# Patient Record
Sex: Female | Born: 1950 | Race: Black or African American | Hispanic: No | Smoking: Never smoker
Health system: Southern US, Community
[De-identification: ages and names within clinical notes are randomized; demographics above are authoritative.]

## PROBLEM LIST (undated history)

## (undated) DIAGNOSIS — I10 Essential (primary) hypertension: Secondary | ICD-10-CM

## (undated) HISTORY — DX: Essential (primary) hypertension: I10

---

## 1996-12-14 HISTORY — PX: LYSIS OF ADHESION: SHX5961

## 1997-12-14 HISTORY — PX: EXPLORATORY LAPAROTOMY: SUR591

## 1998-07-11 ENCOUNTER — Other Ambulatory Visit: Admission: RE | Admit: 1998-07-11 | Discharge: 1998-07-11 | Payer: Self-pay | Admitting: *Deleted

## 1999-10-14 ENCOUNTER — Encounter: Payer: Self-pay | Admitting: *Deleted

## 1999-10-14 ENCOUNTER — Ambulatory Visit (HOSPITAL_COMMUNITY): Admission: RE | Admit: 1999-10-14 | Discharge: 1999-10-14 | Payer: Self-pay | Admitting: *Deleted

## 2000-10-12 ENCOUNTER — Other Ambulatory Visit: Admission: RE | Admit: 2000-10-12 | Discharge: 2000-10-12 | Payer: Self-pay | Admitting: *Deleted

## 2001-09-28 ENCOUNTER — Encounter: Payer: Self-pay | Admitting: *Deleted

## 2001-09-28 ENCOUNTER — Ambulatory Visit (HOSPITAL_COMMUNITY): Admission: RE | Admit: 2001-09-28 | Discharge: 2001-09-28 | Payer: Self-pay | Admitting: *Deleted

## 2002-10-03 ENCOUNTER — Encounter: Payer: Self-pay | Admitting: *Deleted

## 2002-10-03 ENCOUNTER — Ambulatory Visit (HOSPITAL_COMMUNITY): Admission: RE | Admit: 2002-10-03 | Discharge: 2002-10-03 | Payer: Self-pay | Admitting: *Deleted

## 2003-08-16 ENCOUNTER — Ambulatory Visit (HOSPITAL_COMMUNITY): Admission: RE | Admit: 2003-08-16 | Discharge: 2003-08-16 | Payer: Self-pay | Admitting: *Deleted

## 2004-04-23 ENCOUNTER — Other Ambulatory Visit: Admission: RE | Admit: 2004-04-23 | Discharge: 2004-04-23 | Payer: Self-pay | Admitting: Internal Medicine

## 2004-10-08 ENCOUNTER — Ambulatory Visit (HOSPITAL_COMMUNITY): Admission: RE | Admit: 2004-10-08 | Discharge: 2004-10-08 | Payer: Self-pay | Admitting: Internal Medicine

## 2005-10-12 ENCOUNTER — Ambulatory Visit (HOSPITAL_COMMUNITY): Admission: RE | Admit: 2005-10-12 | Discharge: 2005-10-12 | Payer: Self-pay | Admitting: Internal Medicine

## 2006-05-25 ENCOUNTER — Other Ambulatory Visit: Admission: RE | Admit: 2006-05-25 | Discharge: 2006-05-25 | Payer: Self-pay | Admitting: Obstetrics and Gynecology

## 2006-06-03 ENCOUNTER — Encounter: Admission: RE | Admit: 2006-06-03 | Discharge: 2006-06-03 | Payer: Self-pay | Admitting: Obstetrics and Gynecology

## 2006-10-15 ENCOUNTER — Encounter: Admission: RE | Admit: 2006-10-15 | Discharge: 2006-10-15 | Payer: Self-pay | Admitting: Internal Medicine

## 2008-10-30 ENCOUNTER — Ambulatory Visit (HOSPITAL_COMMUNITY): Admission: RE | Admit: 2008-10-30 | Discharge: 2008-10-30 | Payer: Self-pay | Admitting: Internal Medicine

## 2009-11-06 ENCOUNTER — Ambulatory Visit (HOSPITAL_COMMUNITY): Admission: RE | Admit: 2009-11-06 | Discharge: 2009-11-06 | Payer: Self-pay | Admitting: Internal Medicine

## 2010-11-10 ENCOUNTER — Ambulatory Visit (HOSPITAL_COMMUNITY): Admission: RE | Admit: 2010-11-10 | Discharge: 2010-11-10 | Payer: Self-pay | Admitting: Internal Medicine

## 2011-05-01 NOTE — Op Note (Signed)
   NAME:  Joyce Price, STUCKERT                         ACCOUNT NO.:  [id number]   MEDICAL RECORD NO.:  [id number]                   PATIENT TYPE:  AMB   LOCATION:  ENDO                                 FACILITY:  [facility]   PHYSICIAN:  Georgiana Spinner, M.D.                 DATE OF BIRTH:  Apr 16, 1951   DATE OF PROCEDURE:  08/16/2003  DATE OF DISCHARGE:                                 OPERATIVE REPORT   PROCEDURE:  Colonoscopy.   INDICATIONS:  Colon cancer screening.   ANESTHESIA:  Demerol 70 mg, Versed 7 mg.   DESCRIPTION OF PROCEDURE:  With the patient mildly sedated in the left  lateral decubitus position, the Olympus videoscopic colonoscope was inserted  in the rectum and passed under direct vision to the cecum, identified by the  ileocecal valve and appendiceal orifice, both of which were photographed.  From this point the colonoscope was slowly withdrawn, taking circumferential  views of the colonic mucosa, stopping only in the rectum, which appeared  normal on direct and retroflexed view.  The endoscope was straightened and  withdrawn.  The patient's vital signs and pulse oximetry remained stable.  The patient tolerated the procedure well without apparent complications.   FINDINGS:  Unremarkable examination.   PLAN:  Consider repeat examination in five to 10 years.                                               Georgiana Spinner, M.D.    GMO/MEDQ  D:  08/16/2003  T:  08/16/2003  Job:  [id number]

## 2011-11-24 ENCOUNTER — Ambulatory Visit
Payer: No Typology Code available for payment source | Attending: Internal Medicine | Admitting: Rehabilitative and Restorative Service Providers"

## 2011-11-24 DIAGNOSIS — M542 Cervicalgia: Secondary | ICD-10-CM | POA: Insufficient documentation

## 2011-11-24 DIAGNOSIS — R293 Abnormal posture: Secondary | ICD-10-CM | POA: Insufficient documentation

## 2011-11-24 DIAGNOSIS — IMO0001 Reserved for inherently not codable concepts without codable children: Secondary | ICD-10-CM | POA: Insufficient documentation

## 2011-12-02 ENCOUNTER — Ambulatory Visit: Payer: No Typology Code available for payment source | Admitting: Rehabilitation

## 2011-12-10 ENCOUNTER — Ambulatory Visit: Payer: No Typology Code available for payment source | Admitting: Rehabilitation

## 2011-12-16 ENCOUNTER — Ambulatory Visit
Payer: No Typology Code available for payment source | Attending: Internal Medicine | Admitting: Rehabilitative and Restorative Service Providers"

## 2011-12-16 DIAGNOSIS — IMO0001 Reserved for inherently not codable concepts without codable children: Secondary | ICD-10-CM | POA: Insufficient documentation

## 2011-12-16 DIAGNOSIS — M542 Cervicalgia: Secondary | ICD-10-CM | POA: Insufficient documentation

## 2011-12-16 DIAGNOSIS — R293 Abnormal posture: Secondary | ICD-10-CM | POA: Insufficient documentation

## 2011-12-21 ENCOUNTER — Ambulatory Visit: Payer: No Typology Code available for payment source | Admitting: Rehabilitative and Restorative Service Providers"

## 2011-12-22 ENCOUNTER — Ambulatory Visit: Payer: No Typology Code available for payment source | Admitting: Rehabilitative and Restorative Service Providers"

## 2011-12-30 ENCOUNTER — Ambulatory Visit: Payer: No Typology Code available for payment source | Admitting: Rehabilitative and Restorative Service Providers"

## 2012-03-18 ENCOUNTER — Ambulatory Visit: Payer: Self-pay | Admitting: Obstetrics and Gynecology

## 2012-04-11 ENCOUNTER — Encounter: Payer: Self-pay | Admitting: Obstetrics and Gynecology

## 2012-04-11 ENCOUNTER — Ambulatory Visit (INDEPENDENT_AMBULATORY_CARE_PROVIDER_SITE_OTHER): Payer: BC Managed Care – PPO | Admitting: Obstetrics and Gynecology

## 2012-04-11 VITALS — BP 128/60 | Ht 66.0 in | Wt 147.0 lb

## 2012-04-11 DIAGNOSIS — Z124 Encounter for screening for malignant neoplasm of cervix: Secondary | ICD-10-CM

## 2012-04-11 NOTE — Progress Notes (Signed)
The patient is not taking hormone replacement therapy The patient  is taking a Calcium supplement. Post-menopausal bleeding:no  Last Pap: was normal March  2012 Last mammogram: was normal November  2011 Last DEXA scan : T=    Never had one. Last colonoscopy:was normal October 2003  Urinary symptoms: none Normal bowel movements: Yes Reports abuse at home: No  BP 128/60  Ht 5\' 6"  (1.676 m)  Wt 147 lb (66.679 kg)  BMI 23.73 kg/m2 Physical Examination: Neck - supple, no significant adenopathy Chest - clear to auscultation, no wheezes, rales or rhonchi, symmetric air entry Heart - normal rate, regular rhythm, normal S1, S2, no murmurs, rubs, clicks or gallops Abdomen - soft, nontender, nondistended, no masses or organomegaly Breasts - breasts appear normal, no suspicious masses, no skin or nipple changes or axillary nodes Pelvic - normal external genitalia, vulva, vagina, cervix, uterus and adnexa, atrophic Rectal - normal rectal, no masses Musculoskeletal - no joint tenderness, deformity or swelling Extremities - peripheral pulses normal, no pedal edema, no clubbing or cyanosis Skin - normal coloration and turgor, no rashes, no suspicious skin lesions noted Normal AEX Pt due for mammogram 11/13 cononscopy due next year Pap done due to pts request RT one year

## 2012-04-14 LAB — PAP IG W/ RFLX HPV ASCU

## 2012-09-12 ENCOUNTER — Other Ambulatory Visit (HOSPITAL_COMMUNITY): Payer: Self-pay | Admitting: Internal Medicine

## 2012-09-12 DIAGNOSIS — Z1231 Encounter for screening mammogram for malignant neoplasm of breast: Secondary | ICD-10-CM

## 2012-10-05 ENCOUNTER — Ambulatory Visit (HOSPITAL_COMMUNITY)
Admission: RE | Admit: 2012-10-05 | Discharge: 2012-10-05 | Disposition: A | Discharge status: Home / Self Care | Payer: BC Managed Care – PPO | Source: Ambulatory Visit | Attending: Internal Medicine | Admitting: Internal Medicine

## 2012-10-05 DIAGNOSIS — Z1231 Encounter for screening mammogram for malignant neoplasm of breast: Secondary | ICD-10-CM

## 2014-02-15 ENCOUNTER — Telehealth: Payer: Self-pay | Admitting: *Deleted

## 2014-03-09 NOTE — Telephone Encounter (Signed)
Opened in error

## 2014-09-27 ENCOUNTER — Other Ambulatory Visit (HOSPITAL_COMMUNITY): Payer: Self-pay | Admitting: Internal Medicine

## 2014-09-27 DIAGNOSIS — Z1231 Encounter for screening mammogram for malignant neoplasm of breast: Secondary | ICD-10-CM

## 2014-10-11 ENCOUNTER — Ambulatory Visit (HOSPITAL_COMMUNITY): Payer: BC Managed Care – PPO

## 2014-10-12 ENCOUNTER — Ambulatory Visit (HOSPITAL_COMMUNITY)
Admission: RE | Admit: 2014-10-12 | Discharge: 2014-10-12 | Disposition: A | Discharge status: Home / Self Care | Payer: BC Managed Care – PPO | Source: Ambulatory Visit | Attending: Internal Medicine | Admitting: Internal Medicine

## 2014-10-12 DIAGNOSIS — Z1231 Encounter for screening mammogram for malignant neoplasm of breast: Secondary | ICD-10-CM | POA: Diagnosis present

## 2015-10-07 ENCOUNTER — Other Ambulatory Visit: Payer: Self-pay

## 2015-10-07 DIAGNOSIS — Z1231 Encounter for screening mammogram for malignant neoplasm of breast: Secondary | ICD-10-CM

## 2015-10-23 ENCOUNTER — Ambulatory Visit
Admission: RE | Admit: 2015-10-23 | Discharge: 2015-10-23 | Disposition: A | Discharge status: Home / Self Care | Payer: 59 | Source: Ambulatory Visit

## 2015-10-23 DIAGNOSIS — Z1231 Encounter for screening mammogram for malignant neoplasm of breast: Secondary | ICD-10-CM

## 2016-06-11 ENCOUNTER — Ambulatory Visit (INDEPENDENT_AMBULATORY_CARE_PROVIDER_SITE_OTHER): Payer: 59 | Admitting: Family Medicine

## 2016-06-11 VITALS — BP 118/80 | HR 60 | Temp 98.2°F | Resp 16 | Ht 66.0 in | Wt 158.8 lb

## 2016-06-11 DIAGNOSIS — S76912A Strain of unspecified muscles, fascia and tendons at thigh level, left thigh, initial encounter: Secondary | ICD-10-CM | POA: Diagnosis not present

## 2016-06-11 MED ORDER — MELOXICAM 7.5 MG PO TABS: 7.5000 mg | ORAL_TABLET | Freq: Every day | ORAL | Status: DC | PRN

## 2016-06-11 NOTE — Progress Notes (Signed)
   Subjective:    Patient ID: Joyce PomfretWilma M Price, female    DOB: 02-Mar-1951, 65 y.o.   MRN: [id number]  HPI This is a 65 yo female who presents today with 3 days of right sided hip and upper leg pain. Better when she is standing. Pain with sitting and lying down. Pain better with movement. Has taken Tylenol and Tiger Balm with little help. Heat from hot shower helps. No weakness, no numbness or tingling into foot. No recent falls. 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 She stands most of the day and reaches forward over her bench. She did squat down recently to do some watering and felt as though she had over done it.   Blood pressure usually good on med. Had physical recently with good check up.   History reviewed. No pertinent past medical history. No past surgical history on file. Family History  Problem Relation Age of Onset  . Cancer Father    Social History  Substance Use Topics  . Smoking status: Never Smoker   . Smokeless tobacco: Never Used  . Alcohol Use: 0.6 oz/week    1 Glasses of wine per week      Review of Systems Per HPI    Objective:   Physical Exam  Constitutional: She is oriented to person, place, and time. She appears well-developed and well-nourished.  HENT:  Head: Normocephalic and atraumatic.  Eyes: Conjunctivae are normal.  Cardiovascular: Normal rate.   Pulmonary/Chest: Effort normal.  Musculoskeletal: Normal range of motion.       Right hip: She exhibits normal range of motion, normal strength, no tenderness, no bony tenderness and no crepitus. Swelling: upper right buttock slightly larger than left.       Right upper leg: Normal.  Neurological: She is alert and oriented to person, place, and time.  Skin: Skin is warm and dry.  Psychiatric: She has a normal mood and affect. Her behavior is normal. Judgment and thought content normal.  Vitals reviewed.  BP 168/80 mmHg  Pulse 60  Temp(Src)  98.2 F (36.8 C) (Oral)  Resp 16  Ht 5\' 6"  (1.676 m)  Wt 158 lb 12.8 oz (72.031 kg)  BMI 25.64 kg/m2  SpO2 98% Wt Readings from Last 3 Encounters:  06/11/16 158 lb 12.8 oz (72.031 kg)  04/11/12 147 lb (66.679 kg)  BP recheck 118/80     Assessment & Plan:  1. Muscle strain of left thigh, initial encounter - no worrisome findings on PE - meloxicam (MOBIC) 7.5 MG tablet; Take 1 tablet (7.5 mg total) by mouth daily as needed for pain.  Dispense: 30 tablet; Refill: 0. She was instructed to take daily for 5-7 days then PRN - heat prn - gentle stretching twice a day  - RTC if no improvement in 5-7 days, sooner if worsening  Olean Reeeborah Gessner, FNP-BC  [facility], [facility]  06/11/2016 7:15 PM

## 2016-06-11 NOTE — Patient Instructions (Addendum)
     IF you received an x-ray today, you will receive an invoice from [facility]. Please contact [facility] at [phone] with questions or concerns regarding your invoice.   IF you received labwork today, you will receive an invoice from [facility]. Please contact [facility] at [phone] with questions or concerns regarding your invoice.   Our billing staff will not be able to assist you with questions regarding bills from these companies.  You will be contacted with the lab results as soon as they are available. The fastest way to get your results is to activate your My Chart account. Instructions are located on the last page of this paperwork. If you have not heard from us regarding the results in 2 weeks, please contact this office.     Muscle Strain A muscle strain is an injury that occurs when a muscle is stretched beyond its normal length. Usually a small number of muscle fibers are torn when this happens. Muscle strain is rated in degrees. First-degree strains have the least amount of muscle fiber tearing and pain. Second-degree and third-degree strains have increasingly more tearing and pain.  Usually, recovery from muscle strain takes 1-2 weeks. Complete healing takes 5-6 weeks.  CAUSES  Muscle strain happens when a sudden, violent force placed on a muscle stretches it too far. This may occur with lifting, sports, or a fall.  RISK FACTORS Muscle strain is especially common in athletes.  SIGNS AND SYMPTOMS At the site of the muscle strain, there may be:  Pain.  Bruising.  Swelling.  Difficulty using the muscle due to pain or lack of normal function. DIAGNOSIS  Your health care provider will perform a physical exam and ask about your medical history. TREATMENT  Often, the best treatment for a muscle strain is resting, icing, and applying cold compresses to the injured area.  HOME CARE INSTRUCTIONS   Use the PRICE method of  treatment to promote muscle healing during the first 2-3 days after your injury. The PRICE method involves:  Protecting the muscle from being injured again.  Restricting your activity and resting the injured body part.  Icing your injury. To do this, put ice in a plastic bag. Place a towel between your skin and the bag. Then, apply the ice and leave it on from 15-20 minutes each hour. After the third day, switch to moist heat packs.  Apply compression to the injured area with a splint or elastic bandage. Be careful not to wrap it too tightly. This may interfere with blood circulation or increase swelling.  Elevate the injured body part above the level of your heart as often as you can.  Only take over-the-counter or prescription medicines for pain, discomfort, or fever as directed by your health care provider.  Warming up prior to exercise helps to prevent future muscle strains. SEEK MEDICAL CARE IF:   You have increasing pain or swelling in the injured area.  You have numbness, tingling, or a significant loss of strength in the injured area. MAKE SURE YOU:   Understand these instructions.  Will watch your condition.  Will get help right away if you are not doing well or get worse.   This information is not intended to replace advice given to you by your health care provider. Make sure you discuss any questions you have with your health care provider.   Document Released: 11/30/2005 Document Revised: 09/20/2013 Document Reviewed: 06/29/2013 Elsevier Interactive Patient Education [employer]2016 Elsevier Inc.

## 2016-09-29 ENCOUNTER — Other Ambulatory Visit: Payer: Self-pay | Admitting: Internal Medicine

## 2016-09-29 DIAGNOSIS — Z1231 Encounter for screening mammogram for malignant neoplasm of breast: Secondary | ICD-10-CM

## 2016-10-26 ENCOUNTER — Ambulatory Visit: Payer: 59

## 2016-11-17 ENCOUNTER — Ambulatory Visit
Admission: RE | Admit: 2016-11-17 | Discharge: 2016-11-17 | Disposition: A | Discharge status: Home / Self Care | Payer: 59 | Source: Ambulatory Visit | Attending: Internal Medicine | Admitting: Internal Medicine

## 2016-11-17 DIAGNOSIS — Z1231 Encounter for screening mammogram for malignant neoplasm of breast: Secondary | ICD-10-CM

## 2017-06-07 ENCOUNTER — Ambulatory Visit (INDEPENDENT_AMBULATORY_CARE_PROVIDER_SITE_OTHER): Payer: BLUE CROSS/BLUE SHIELD | Admitting: Physician Assistant

## 2017-06-07 ENCOUNTER — Encounter: Payer: Self-pay | Admitting: Physician Assistant

## 2017-06-07 VITALS — BP 140/60 | HR 67 | Temp 98.7°F | Resp 18 | Ht 67.0 in | Wt 146.0 lb

## 2017-06-07 DIAGNOSIS — E785 Hyperlipidemia, unspecified: Secondary | ICD-10-CM | POA: Insufficient documentation

## 2017-06-07 DIAGNOSIS — I10 Essential (primary) hypertension: Secondary | ICD-10-CM | POA: Insufficient documentation

## 2017-06-07 DIAGNOSIS — M542 Cervicalgia: Secondary | ICD-10-CM

## 2017-06-07 MED ORDER — CYCLOBENZAPRINE HCL 10 MG PO TABS: 5.0000 mg | ORAL_TABLET | Freq: Three times a day (TID) | ORAL | 0 refills | Status: DC | PRN

## 2017-06-07 NOTE — Progress Notes (Signed)
Patient ID: Joyce Price, female    DOB: 1951/06/24, 66 y.o.   MRN: [id number]  PCP: Merri Brunette, MD  Chief Complaint  Patient presents with  . Neck Pain    x6 days, per pt stiff and achy, and hurts to turn head.    Subjective:   Presents for evaluation of neck pain.  This pain began 6 days ago, following a trip to her granddaughter's pediatrician, during which she rested with her head against the wall x 10 minutes.  Since then, she has experienced throbbing pain, mostly on the RIGHT. The neck feels stiff, and the pain is worse with rotation of the head and flexion of the neck, also with the action of picking up her grandchildren.  Acetaminophen helps. Pain is 5-6/10. No radicular pain, paresthesias, weakness. She is able to perform her usual activities of daily living.   Review of Systems As above. No CP< SOB, HA, dizziness.    Patient Active Problem List   Diagnosis Date Noted  . Benign essential HTN 06/07/2017  . Hyperlipidemia 06/07/2017     Prior to Admission medications   Medication Sig Start Date End Date Taking? Authorizing Provider  aspirin 81 MG tablet Take 81 mg by mouth daily.   Yes [provider]  calcium citrate-vitamin D (CITRACAL+D) 315-200 MG-UNIT per tablet Take 1 tablet by mouth 2 (two) times daily.   Yes [provider]  telmisartan-hydrochlorothiazide (MICARDIS HCT) 80-25 MG per tablet Take 1 tablet by mouth daily.   Yes [provider]  rosuvastatin (CRESTOR) 20 MG tablet  06/03/17   [provider]     No Known Allergies     Objective:  Physical Exam  Constitutional: She is oriented to person, place, and time. She appears well-developed and well-nourished. She is active and cooperative. No distress.  BP 140/60 (BP Location: Right Arm, Patient Position: Sitting, Cuff Size: Normal)   Pulse 67   Temp 98.7 F (37.1 C) (Oral)   Resp 18   Ht 5\' 7"  (1.702 m)   Wt 146 lb (66.2 kg)   SpO2 100%    BMI 22.87 kg/m   HENT:  Head: Normocephalic and atraumatic.  Right Ear: Hearing normal.  Left Ear: Hearing normal.  Eyes: Conjunctivae are normal. No scleral icterus.  Neck: Normal range of motion. Neck supple. No thyromegaly present.  Cardiovascular: Normal rate and regular rhythm.   Murmur heard.  Systolic murmur is present with a grade of 2/6  Pulses:      Radial pulses are 2+ on the right side, and 2+ on the left side.  Pulmonary/Chest: Effort normal and breath sounds normal.  Musculoskeletal:       Cervical back: She exhibits decreased range of motion (due to pain with rotation, flexion), tenderness (paraspinous muscles, R>L) and pain. She exhibits no bony tenderness, no swelling, no edema, no deformity, no laceration, no spasm and normal pulse.  Lymphadenopathy:       Head (right side): No tonsillar, no preauricular, no posterior auricular and no occipital adenopathy present.       Head (left side): No tonsillar, no preauricular, no posterior auricular and no occipital adenopathy present.    She has no cervical adenopathy.       Right: No supraclavicular adenopathy present.       Left: No supraclavicular adenopathy present.  Neurological: She is alert and oriented to person, place, and time. No sensory deficit.  Skin: Skin is warm, dry and intact. No  rash noted. No cyanosis or erythema. Nails show no clubbing.  Psychiatric: She has a normal mood and affect. Her speech is normal and behavior is normal.           Assessment & Plan:   1. Neck pain Continue acetaminophen. Add heating pad/warm compress. Add muscle relaxer. - cyclobenzaprine (FLEXERIL) 10 MG tablet; Take 0.5-1 tablets (5-10 mg total) by mouth 3 (three) times daily as needed for muscle spasms.  Dispense: 30 tablet; Refill: 0    Return if symptoms worsen or fail to improve.   Fernande Brashelle S. Narciso Stoutenburg, PA-C [facility]

## 2017-06-07 NOTE — Patient Instructions (Addendum)
Continue the Tylenol. Use a warm compress/heating pad for 15-20 minutes 2-4 times daily. Gentle range of motion of the neck AFTER the heating pad can help. The cyclobenzaprine (Flexeril) can cause drowsiness. You may need to reserve it for bedtime.    IF you received an x-ray today, you will receive an invoice from [facility]. Please contact [facility] at [phone] with questions or concerns regarding your invoice.   IF you received labwork today, you will receive an invoice from [facility]. Please contact [facility] at [phone] with questions or concerns regarding your invoice.   Our billing staff will not be able to assist you with questions regarding bills from these companies.  You will be contacted with the lab results as soon as they are available. The fastest way to get your results is to activate your My Chart account. Instructions are located on the last page of this paperwork. If you have not heard from us regarding the results in 2 weeks, please contact this office.

## 2017-06-07 NOTE — Progress Notes (Signed)
Subjective:    Patient ID: Joyce Price, female    DOB: 1951/05/12, 66 y.o.   MRN: [id number]  HPI Ms. Joyce Price is a 66 year old female who presents today with neck pain. Neck pain onset Tuesday after she was leaning the right side of her head against a wall while waiting at the doctor's office for her granddaughter. She was leaning against the wall for about 10 minutes. The pain is throbbing and bilateral but mostly on the right side.  She says her neck feels stiff and hurts when she moves her head side-to-side to look over her shoulder or while leaning her head forward. It is also aggravated with picking up her grandkids or heavy items like watering cans or big packs of water. Tylenol helps, but she has not taken any today. The pain severity is about a 5-6/10. She denies shoulder pain/tenderness or pain radiating down her arms or legs. She reports it does not interfere with her daily activities.  Patient denies fever, chills, fatigue, headache or numbness, tingling, or weakness in her extremities. She also denies dizziness, chest pain, shortness of breath, dyspnea, abdominal pain, and nausea.  Review of Systems See above.  Patient Active Problem List   Diagnosis Date Noted  . Benign essential HTN 06/07/2017  . Hyperlipidemia 06/07/2017   Prior to Admission medications   Medication Sig Start Date End Date Taking? Authorizing Provider  aspirin 81 MG tablet Take 81 mg by mouth daily.   Yes [provider]  calcium citrate-vitamin D (CITRACAL+D) 315-200 MG-UNIT per tablet Take 1 tablet by mouth 2 (two) times daily.   Yes [provider]  telmisartan-hydrochlorothiazide (MICARDIS HCT) 80-25 MG per tablet Take 1 tablet by mouth daily.   Yes [provider]  cyclobenzaprine (FLEXERIL) 10 MG tablet Take 0.5-1 tablets (5-10 mg total) by mouth 3 (three) times daily as needed for muscle spasms. 06/07/17   Porfirio Oar, PA-C  rosuvastatin (CRESTOR) 20 MG tablet   06/03/17   [provider]   No Known Allergies    Objective:   Physical Exam  Constitutional: She appears well-developed and well-nourished. She is cooperative. No distress.  HENT:  Head: Normocephalic.  Right Ear: External ear normal.  Left Ear: External ear normal.  Nose: Nose normal.  Neck: Muscular tenderness present. No spinous process tenderness present. Decreased range of motion present.  Cardiovascular: Normal rate, regular rhythm, S1 normal and S2 normal.   Murmur heard.  Systolic murmur is present  Pulses:      Radial pulses are 2+ on the right side, and 2+ on the left side.       Dorsalis pedis pulses are 2+ on the right side, and 2+ on the left side.  Pulmonary/Chest: Breath sounds normal. No accessory muscle usage. She is in respiratory distress.  Musculoskeletal:       Cervical back: She exhibits decreased range of motion and tenderness. She exhibits no bony tenderness and no deformity.       Thoracic back: Normal. She exhibits normal range of motion, no tenderness and no bony tenderness.  Neurological: She is alert. She has normal strength.  Reflex Scores:      Brachioradialis reflexes are 2+ on the right side and 2+ on the left side.      Patellar reflexes are 2+ on the right side and 2+ on the left side. Skin: Skin is warm, dry and intact.  Vitals reviewed.    Assessment & Plan:   1. Neck pain  Rx Flexeril 10mg  orally three times a day. Discussed this may cause drowsiness, so she may opt to take it at bedtime. Encouraged continuing Tylenol to help alleviate the pain as well. Instructed patient to apply a warm compress to her neck 2-4 times daily followed by gentle range of motion exercises. Advised calling if she does not experience improvement within 2 weeks. - cyclobenzaprine (FLEXERIL) 10 MG tablet; Take 0.5-1 tablets (5-10 mg total) by mouth 3 (three) times daily as needed for muscle spasms.  Dispense: 30 tablet; Refill: 0

## 2017-07-10 ENCOUNTER — Encounter: Payer: Self-pay | Admitting: Osteopathic Medicine

## 2017-07-10 ENCOUNTER — Ambulatory Visit (INDEPENDENT_AMBULATORY_CARE_PROVIDER_SITE_OTHER): Payer: Medicare Other | Admitting: Osteopathic Medicine

## 2017-07-10 VITALS — BP 129/81 | HR 86 | Temp 98.6°F | Resp 17 | Ht 66.5 in | Wt 143.2 lb

## 2017-07-10 DIAGNOSIS — B9789 Other viral agents as the cause of diseases classified elsewhere: Secondary | ICD-10-CM | POA: Diagnosis not present

## 2017-07-10 DIAGNOSIS — J069 Acute upper respiratory infection, unspecified: Secondary | ICD-10-CM

## 2017-07-10 MED ORDER — AMOXICILLIN-POT CLAVULANATE 875-125 MG PO TABS: 1.0000 | ORAL_TABLET | Freq: Two times a day (BID) | ORAL | 0 refills | Status: DC

## 2017-07-10 MED ORDER — BENZONATATE 200 MG PO CAPS: 200.0000 mg | ORAL_CAPSULE | Freq: Three times a day (TID) | ORAL | 1 refills | Status: DC | PRN

## 2017-07-10 MED ORDER — IPRATROPIUM BROMIDE 0.06 % NA SOLN: 2.0000 | Freq: Four times a day (QID) | NASAL | 0 refills | Status: DC

## 2017-07-10 NOTE — Patient Instructions (Addendum)
Plan:  I think we are dealing with the tail end of a viral upper respiratory infection which is causing sinus drainage into sore throat, coughing up mucus.   We'll treat with nasal spray & cough medicine for the next few days and hopefully you will start to feel much better.   If you are not feeling better after another 2-3 days, please start the antibiotics for a bacterial sinus infection. (Prescription printed)   If severe or worsening cough, fever, or other concerns develop please come back and see us as you may need a chest x-ray, but at this point I do not hear anything in the lungs concerning for a lower respiratory infection such as bronchitis or pneumonia    IF you received an x-ray today, you will receive an invoice from [facility]. Please contact [facility] at [phone] with questions or concerns regarding your invoice.   IF you received labwork today, you will receive an invoice from [facility]. Please contact [facility] at [phone] with questions or concerns regarding your invoice.   Our billing staff will not be able to assist you with questions regarding bills from these companies.  You will be contacted with the lab results as soon as they are available. The fastest way to get your results is to activate your My Chart account. Instructions are located on the last page of this paperwork. If you have not heard from us regarding the results in 2 weeks, please contact this office.

## 2017-07-10 NOTE — Progress Notes (Signed)
HPI: Joyce Price is a 66 y.o. female who [facility] today 07/10/17 for chief complaint of:  Chief Complaint  Patient presents with  . Sore Throat    C/O ST, PND, hoarseness & cough x 1 week. Tried sucking on lemons, & throat lozengers & garled salt water, & tried Coricidin HBP    Acute Illness:  . Context:   . Location & Quality: sore throat, postnasal drip, cough  . Assoc signs/symptoms: see ROS . Duration: 7+ days . Modifying factors: has tried the following OTC/Rx medications: see above    Past medical, social and family history reviewed.   Immune compromising conditions or other risk factors: none  Current medications and allergies reviewed.     Review of Systems:  Constitutional: No  fever/chills  HEENT: No  headache, Yes  sore throat, No  swollen glands  Cardiovascular: No chest pain  Respiratory:Yes  cough, No  shortness of breath  Gastrointestinal: No  nausea, No  vomiting,  No  diarrhea  Musculoskeletal:   No  myalgia/arthralgia  Skin/Integument:  No  rash   Detailed Exam:  BP 129/81   Pulse 86   Temp 98.6 F (37 C) (Oral)   Resp 17   Ht 5' 6.5" (1.689 m)   Wt 143 lb 4 oz (65 kg)   SpO2 96%   BMI 22.77 kg/m   Constitutional:   VSS, see above.   General Appearance: alert, well-developed, well-nourished, NAD  Eyes:   Normal lids and conjunctive, non-icteric sclera  Ears, Nose, Mouth, Throat:   Normal external inspection ears/nares  Normal mouth/lips/gums, MMM  normal TM  posterior pharynx without erythema, without exudate  nasal mucosa normal  Skin:  Normal inspection, no rash or concerning lesions noted on limited exam  Neck:   No masses, trachea midline. normal lymph nodes  Respiratory:   Normal respiratory effort.   No  wheeze/rhonchi/rales  Cardiovascular:   S1/S2 normal, no murmur/rub/gallop auscultated. RRR.       ASSESSMENT/PLAN:  Viral URI with cough - Since  Medicare for now, printed antibiotics in case no improvement. Instructions below - Plan: benzonatate (TESSALON) 200 MG capsule, ipratropium (ATROVENT) 0.06 % nasal spray, amoxicillin-clavulanate (AUGMENTIN) 875-125 MG tablet     Patient Instructions   Plan:  I think we are dealing with the tail end of a viral upper respiratory infection which is causing sinus drainage into sore throat, coughing up mucus.   We'll treat with nasal spray & cough medicine for the next few days and hopefully you will start to feel much better.   If you are not feeling better after another 2-3 days, please start the antibiotics for a bacterial sinus infection. (Prescription printed)   If severe or worsening cough, fever, or other concerns develop please come back and see us as you may need a chest x-ray, but at this point I do not hear anything in the lungs concerning for a lower respiratory infection such as bronchitis or pneumonia     Visit summary was printed for the patient with medications and pertinent instructions for patient to review. ER/RTC precautions reviewed. All questions answered. Return if symptoms worsen or fail to improve, and as directed by your primary care doctor for routine care.

## 2018-02-26 ENCOUNTER — Other Ambulatory Visit: Payer: Self-pay

## 2018-02-26 ENCOUNTER — Ambulatory Visit (INDEPENDENT_AMBULATORY_CARE_PROVIDER_SITE_OTHER): Payer: Medicare Other | Admitting: Physician Assistant

## 2018-02-26 ENCOUNTER — Encounter: Payer: Self-pay | Admitting: Physician Assistant

## 2018-02-26 DIAGNOSIS — J309 Allergic rhinitis, unspecified: Secondary | ICD-10-CM

## 2018-02-26 DIAGNOSIS — J302 Other seasonal allergic rhinitis: Secondary | ICD-10-CM | POA: Insufficient documentation

## 2018-02-26 MED ORDER — IPRATROPIUM BROMIDE 0.06 % NA SOLN: 2.0000 | Freq: Four times a day (QID) | NASAL | 0 refills | Status: DC

## 2018-02-26 MED ORDER — LEVOCETIRIZINE DIHYDROCHLORIDE 5 MG PO TABS: 5.0000 mg | ORAL_TABLET | Freq: Every evening | ORAL | 11 refills | Status: AC

## 2018-02-26 NOTE — Patient Instructions (Addendum)
Buy Opcon-A or Naphcon-A at your pharmacy.  Start taking Xyzal every night for the next month.  Use atrovent as directed.  Stay well hydrated - drink 1-2 liters of water daily.  Come back if your symptoms are not improving.   Allergic Rhinitis, Adult Allergic rhinitis is an allergic reaction that affects the mucous membrane inside the nose. It causes sneezing, a runny or stuffy nose, and the feeling of mucus going down the back of the throat (postnasal drip). Allergic rhinitis can be mild to severe. There are two types of allergic rhinitis:  Seasonal. This type is also called hay fever. It happens only during certain seasons.  Perennial. This type can happen at any time of the year.  What are the causes? This condition happens when the body's defense system (immune system) responds to certain harmless substances called allergens as though they were germs.  Seasonal allergic rhinitis is triggered by pollen, which can come from grasses, trees, and weeds. Perennial allergic rhinitis may be caused by:  House dust mites.  Pet dander.  Mold spores.  What are the signs or symptoms? Symptoms of this condition include:  Sneezing.  Runny or stuffy nose (nasal congestion).  Postnasal drip.  Itchy nose.  Tearing of the eyes.  Trouble sleeping.  Daytime sleepiness.  How is this diagnosed? This condition may be diagnosed based on:  Your medical history.  A physical exam.  Tests to check for related conditions, such as: ? Asthma. ? Pink eye. ? Ear infection. ? Upper respiratory infection.  Tests to find out which allergens trigger your symptoms. These may include skin or blood tests.  How is this treated? There is no cure for this condition, but treatment can help control symptoms. Treatment may include:  Taking medicines that block allergy symptoms, such as antihistamines. Medicine may be given as a shot, nasal spray, or pill.  Avoiding the  allergen.  Desensitization. This treatment involves getting ongoing shots until your body becomes less sensitive to the allergen. This treatment may be done if other treatments do not help.  If taking medicine and avoiding the allergen does not work, new, stronger medicines may be prescribed.  Follow these instructions at home:  Find out what you are allergic to. Common allergens include smoke, dust, and pollen.  Avoid the things you are allergic to. These are some things you can do to help avoid allergens: ? Replace carpet with wood, tile, or vinyl flooring. Carpet can trap dander and dust. ? Do not smoke. Do not allow smoking in your home. ? Change your heating and air conditioning filter at least once a month. ? During allergy season:  Keep windows closed as much as possible.  Plan outdoor activities when pollen counts are lowest. This is usually during the evening hours.  When coming indoors, change clothing and shower before sitting on furniture or bedding.  Take over-the-counter and prescription medicines only as told by your health care provider.  Keep all follow-up visits as told by your health care provider. This is important. Contact a health care provider if:  You have a fever.  You develop a persistent cough.  You make whistling sounds when you breathe (you wheeze).  Your symptoms interfere with your normal daily activities. Get help right away if:  You have shortness of breath. Summary  This condition can be managed by taking medicines as directed and avoiding allergens.  Contact your health care provider if you develop a persistent cough or fever.  During  allergy season, keep windows closed as much as possible. This information is not intended to replace advice given to you by your health care provider. Make sure you discuss any questions you have with your health care provider. Document Released: 08/25/2001 Document Revised: 01/07/2017 Document Reviewed:  01/07/2017 Elsevier Interactive Patient Education  2018 [employer].   IF you received an x-ray today, you will receive an invoice from [facility]. Please contact [facility] at [phone] with questions or concerns regarding your invoice.   IF you received labwork today, you will receive an invoice from [place]. Please contact [facility] at [phone] with questions or concerns regarding your invoice.   Our billing staff will not be able to assist you with questions regarding bills from these companies.  You will be contacted with the lab results as soon as they are available. The fastest way to get your results is to activate your My Chart account. Instructions are located on the last page of this paperwork. If you have not heard from [place] regarding the results in 2 weeks, please contact this office.

## 2018-02-26 NOTE — Progress Notes (Signed)
Joyce Price  PCP: Merri BrunettePharr, Walter, MD  Subjective:  Pt is a 67 year old female who presents to clinic for nasal congestion x 4 days. C/o runny nose, sneezing, and puffiness under her eyes when she wakes up in the mornings. She was taking allegra and atrovent. She is out of atrovent. Benadryl makes her sleepy. ROS below.    Review of Systems  Constitutional: Negative for chills, diaphoresis, fatigue and fever.  HENT: Positive for congestion, postnasal drip and rhinorrhea. Negative for sinus pressure, sinus pain and sore throat.   Eyes: Negative for pain, discharge, redness and itching.  Respiratory: Negative for cough, shortness of breath and wheezing.   Psychiatric/Behavioral: Negative for sleep disturbance.    Patient Active Problem List   Diagnosis Date Noted  . Benign essential HTN 06/07/2017  . Hyperlipidemia 06/07/2017    Current Outpatient Medications on File Prior to Visit  Medication Sig Dispense Refill  . aspirin 81 MG tablet Take 81 mg by mouth daily.    . calcium citrate-vitamin D (CITRACAL+D) 315-200 MG-UNIT per tablet Take 1 tablet by mouth 2 (two) times daily.    Marland Kitchen. ipratropium (ATROVENT) 0.06 % nasal spray Place 2 sprays into both nostrils 4 (four) times daily. As needed for nasal drainage/sinus congestion 15 mL 0  . rosuvastatin (CRESTOR) 20 MG tablet   2  . telmisartan-hydrochlorothiazide (MICARDIS HCT) 80-25 MG per tablet Take 1 tablet by mouth daily.    . cyclobenzaprine (FLEXERIL) 10 MG tablet Take 0.5-1 tablets (5-10 mg total) by mouth 3 (three) times daily as needed for muscle spasms. (Patient not taking: Reported on 02/26/2018) 30 tablet 0   No current facility-administered medications on file prior to visit.     No Known Allergies   Objective:  BP 136/76   Pulse 85   Temp 98.6 F (37 C) (Oral)   Resp 16   Ht 5\' 7"  (1.702 m)   Wt 145 lb 12.8 oz (66.1 kg)   SpO2 99%   BMI 22.84 kg/m   Physical Exam    Constitutional: She is oriented to person, place, and time and well-developed, well-nourished, and in no distress. No distress.  HENT:  Right Ear: Tympanic membrane normal.  Left Ear: Tympanic membrane normal.  Nose: Mucosal edema present. No rhinorrhea. Right sinus exhibits no maxillary sinus tenderness and no frontal sinus tenderness. Left sinus exhibits no maxillary sinus tenderness and no frontal sinus tenderness.  Mouth/Throat: Oropharynx is clear and moist and mucous membranes are normal.  Eyes:  Infraorbital edema, mild  Cardiovascular: Normal rate, regular rhythm and normal heart sounds.  Pulmonary/Chest: Effort normal and breath sounds normal. No respiratory distress. She has no wheezes. She has no rales.  Neurological: She is alert and oriented to person, place, and time. GCS score is 15.  Skin: Skin is warm and dry.  Psychiatric: Mood, memory, affect and judgment normal.  Vitals reviewed.   Assessment and Plan :  1. Allergic rhinitis, unspecified seasonality, unspecified trigger - ipratropium (ATROVENT) 0.06 % nasal spray; Place 2 sprays into both nostrils 4 (four) times daily. As needed for nasal drainage/sinus congestion  Dispense: 15 mL; Refill: 0 - levocetirizine (XYZAL) 5 MG tablet; Take 1 tablet (5 mg total) by mouth every evening.  Dispense: 30 tablet; Refill: 11 - Pt c/o several days of sneezing, runny nose and infraorbital edema. Plan to treat for seasonal allergies. Discussed symptom prevention and management. RTC if no improvement.   Joyce Moira Umholtz, PA-C  Primary Care at [facility] 02/26/2018 10:41 AM

## 2018-03-11 DIAGNOSIS — H5213 Myopia, bilateral: Secondary | ICD-10-CM | POA: Diagnosis not present

## 2018-05-10 DIAGNOSIS — I10 Essential (primary) hypertension: Secondary | ICD-10-CM | POA: Diagnosis not present

## 2018-05-10 DIAGNOSIS — Z0001 Encounter for general adult medical examination with abnormal findings: Secondary | ICD-10-CM | POA: Diagnosis not present

## 2018-05-10 DIAGNOSIS — E7800 Pure hypercholesterolemia, unspecified: Secondary | ICD-10-CM | POA: Diagnosis not present

## 2018-05-10 DIAGNOSIS — E559 Vitamin D deficiency, unspecified: Secondary | ICD-10-CM | POA: Diagnosis not present

## 2018-05-17 DIAGNOSIS — D72819 Decreased white blood cell count, unspecified: Secondary | ICD-10-CM | POA: Diagnosis not present

## 2018-05-17 DIAGNOSIS — E559 Vitamin D deficiency, unspecified: Secondary | ICD-10-CM | POA: Diagnosis not present

## 2018-05-17 DIAGNOSIS — E7800 Pure hypercholesterolemia, unspecified: Secondary | ICD-10-CM | POA: Diagnosis not present

## 2018-05-17 DIAGNOSIS — I10 Essential (primary) hypertension: Secondary | ICD-10-CM | POA: Diagnosis not present

## 2018-05-17 DIAGNOSIS — M5431 Sciatica, right side: Secondary | ICD-10-CM | POA: Diagnosis not present

## 2018-06-01 ENCOUNTER — Other Ambulatory Visit: Payer: Self-pay | Admitting: Internal Medicine

## 2018-06-01 DIAGNOSIS — Z1231 Encounter for screening mammogram for malignant neoplasm of breast: Secondary | ICD-10-CM

## 2018-06-22 ENCOUNTER — Ambulatory Visit
Admission: RE | Admit: 2018-06-22 | Discharge: 2018-06-22 | Disposition: A | Discharge status: Home / Self Care | Payer: Medicare Other | Source: Ambulatory Visit | Attending: Internal Medicine | Admitting: Internal Medicine

## 2018-06-22 DIAGNOSIS — Z1231 Encounter for screening mammogram for malignant neoplasm of breast: Secondary | ICD-10-CM

## 2018-06-23 DIAGNOSIS — Z124 Encounter for screening for malignant neoplasm of cervix: Secondary | ICD-10-CM | POA: Diagnosis not present

## 2018-06-23 DIAGNOSIS — Z6823 Body mass index (BMI) 23.0-23.9, adult: Secondary | ICD-10-CM | POA: Diagnosis not present

## 2018-07-12 ENCOUNTER — Encounter: Payer: Self-pay | Admitting: Family Medicine

## 2018-07-12 ENCOUNTER — Ambulatory Visit (INDEPENDENT_AMBULATORY_CARE_PROVIDER_SITE_OTHER): Payer: Medicare Other | Admitting: Family Medicine

## 2018-07-12 ENCOUNTER — Other Ambulatory Visit: Payer: Self-pay

## 2018-07-12 VITALS — BP 137/81 | HR 57 | Temp 97.8°F | Resp 17 | Ht 67.0 in | Wt 147.4 lb

## 2018-07-12 DIAGNOSIS — R3915 Urgency of urination: Secondary | ICD-10-CM | POA: Diagnosis not present

## 2018-07-12 DIAGNOSIS — R1032 Left lower quadrant pain: Secondary | ICD-10-CM | POA: Diagnosis not present

## 2018-07-12 LAB — POCT CBC
GRANULOCYTE PERCENT: 57.5 % (ref 37–80)
HEMATOCRIT: 41.5 % (ref 37.7–47.9)
Hemoglobin: 13 g/dL (ref 12.2–16.2)
Lymph, poc: 1.6 (ref 0.6–3.4)
MCH: 27.8 pg (ref 27–31.2)
MCHC: 31.3 g/dL — AB (ref 31.8–35.4)
MCV: 88.7 fL (ref 80–97)
MID (CBC): 0.1 (ref 0–0.9)
MPV: 7.3 fL (ref 0–99.8)
POC GRANULOCYTE: 2.3 (ref 2–6.9)
POC LYMPH PERCENT: 40.3 %L (ref 10–50)
POC MID %: 2.2 %M (ref 0–12)
Platelet Count, POC: 325 10*3/uL (ref 142–424)
RBC: 4.68 M/uL (ref 4.04–5.48)
RDW, POC: 13.3 %
WBC: 4 10*3/uL — AB (ref 4.6–10.2)

## 2018-07-12 LAB — POCT URINALYSIS DIP (MANUAL ENTRY)
BILIRUBIN UA: NEGATIVE mg/dL
Bilirubin, UA: NEGATIVE
Glucose, UA: NEGATIVE mg/dL
Leukocytes, UA: NEGATIVE
Nitrite, UA: NEGATIVE
PROTEIN UA: NEGATIVE mg/dL
Urobilinogen, UA: 0.2 E.U./dL
pH, UA: 6 (ref 5.0–8.0)

## 2018-07-12 LAB — POC MICROSCOPIC URINALYSIS (UMFC): MUCUS RE: ABSENT

## 2018-07-12 NOTE — Progress Notes (Signed)
Chief Complaint  Patient presents with  . Urinary Tract Infection    possible uti- dull pain in left side mainly after eating and when lying down, noticed last thursday or Friday, does have the urgency to go to br.  Taking tylenol for the pain    HPI  She feels some urgency to go She states that she also has left side lower abdominal pain No changes to her BM Sometimes if she eats she can feel like the "pain wakes up" She reports that after emptying her bladder she has less urgency She has never had a UTI She has been drinking more water  A few weeks ago she had a pap smear and is waiting for her results from Gynecology   Past Medical History:  Diagnosis Date  . Hypertension     Current Outpatient Medications  Medication Sig Dispense Refill  . aspirin 81 MG tablet Take 81 mg by mouth daily.    . calcium citrate-vitamin D (CITRACAL+D) 315-200 MG-UNIT per tablet Take 1 tablet by mouth 2 (two) times daily.    Marland Kitchen. ipratropium (ATROVENT) 0.06 % nasal spray Place 2 sprays into both nostrils 4 (four) times daily. As needed for nasal drainage/sinus congestion 15 mL 0  . levocetirizine (XYZAL) 5 MG tablet Take 1 tablet (5 mg total) by mouth every evening. 30 tablet 11  . telmisartan-hydrochlorothiazide (MICARDIS HCT) 80-25 MG per tablet Take 1 tablet by mouth daily.    . rosuvastatin (CRESTOR) 20 MG tablet   2   No current facility-administered medications for this visit.     Allergies: No Known Allergies  No past surgical history on file.  Social History   Socioeconomic History  . Marital status: Widowed    Spouse name: Not on file  . Number of children: Not on file  . Years of education: Not on file  . Highest education level: Not on file  Occupational History  . Not on file  Social Needs  . Financial resource strain: Not on file  . Food insecurity:    Worry: Not on file    Inability: Not on file  . Transportation needs:    Medical: Not on file    Non-medical: Not on  file  Tobacco Use  . Smoking status: Never Smoker  . Smokeless tobacco: Never Used  Substance and Sexual Activity  . Alcohol use: Yes    Alcohol/week: 0.6 oz    Types: 1 Glasses of wine per week  . Drug use: No  . Sexual activity: Never    Birth control/protection: None  Lifestyle  . Physical activity:    Days per week: Not on file    Minutes per session: Not on file  . Stress: Not on file  Relationships  . Social connections:    Talks on phone: Not on file    Gets together: Not on file    Attends religious service: Not on file    Active member of club or organization: Not on file    Attends meetings of clubs or organizations: Not on file    Relationship status: Not on file  Other Topics Concern  . Not on file  Social History Narrative  . Not on file    Family History  Problem Relation Age of Onset  . Cancer Father   . Hypertension Sister   . Hypertension Brother      ROS Review of Systems See HPI Constitution: No fevers or chills No malaise No diaphoresis Skin: No rash  or itching Eyes: no blurry vision, no double vision GU: see hpi Neuro: no dizziness or headaches all others reviewed and negative   Objective: Vitals:   07/12/18 1220  BP: 137/81  Pulse: (!) 57  Resp: 17  Temp: 97.8 F (36.6 C)  TempSrc: Oral  SpO2: 98%  Weight: 147 lb 6.4 oz (66.9 kg)  Height: 5\' 7"  (1.702 m)    Physical Exam  Constitutional: She appears well-developed and well-nourished.  HENT:  Head: Normocephalic and atraumatic.  Abdominal: Normal appearance and bowel sounds are normal. She exhibits no shifting dullness, no distension, no pulsatile liver, no fluid wave, no abdominal bruit, no ascites, no pulsatile midline mass and no mass. There is tenderness in the left lower quadrant. There is no rigidity, no rebound, no guarding, no CVA tenderness, no tenderness at McBurney's point and negative Murphy's sign.    Assessment and Plan Joyce Price was seen today for urinary tract  infection.  Diagnoses and all orders for this visit:  Urgency of urination -     POCT urinalysis dipstick -     POCT Microscopic Urinalysis ([facility])  Abdominal pain, left lower quadrant -     POCT Microscopic Urinalysis ([facility]) -     POCT CBC   Discussed that her urine is not indicative of infection Her symptoms do not point to diverticular disease at this point She should monitor her symptoms and return if they progress ER precautions advised  Discharged home with plans for hydration and monitoring symptoms   Joyce Price A Creta Levin

## 2018-07-12 NOTE — Patient Instructions (Signed)
     IF you received an x-ray today, you will receive an invoice from [facility]. Please contact [facility] at [phone] with questions or concerns regarding your invoice.   IF you received labwork today, you will receive an invoice from [facility]. Please contact [facility] at [phone] with questions or concerns regarding your invoice.   Our billing staff will not be able to assist you with questions regarding bills from these companies.  You will be contacted with the lab results as soon as they are available. The fastest way to get your results is to activate your My Chart account. Instructions are located on the last page of this paperwork. If you have not heard from us regarding the results in 2 weeks, please contact this office.     

## 2018-12-01 ENCOUNTER — Ambulatory Visit: Payer: Medicare Other | Admitting: Family Medicine

## 2018-12-01 ENCOUNTER — Other Ambulatory Visit: Payer: Self-pay

## 2018-12-01 ENCOUNTER — Ambulatory Visit (INDEPENDENT_AMBULATORY_CARE_PROVIDER_SITE_OTHER): Payer: Medicare Other | Admitting: Family Medicine

## 2018-12-01 ENCOUNTER — Encounter: Payer: Self-pay | Admitting: Family Medicine

## 2018-12-01 VITALS — BP 135/68 | HR 70 | Temp 98.9°F | Resp 18 | Ht 67.0 in | Wt 152.4 lb

## 2018-12-01 DIAGNOSIS — J069 Acute upper respiratory infection, unspecified: Secondary | ICD-10-CM | POA: Diagnosis not present

## 2018-12-01 MED ORDER — IPRATROPIUM BROMIDE 0.03 % NA SOLN: 2.0000 | Freq: Four times a day (QID) | NASAL | 0 refills | Status: AC

## 2018-12-01 MED ORDER — AZITHROMYCIN 250 MG PO TABS: ORAL_TABLET | ORAL | 0 refills | Status: DC

## 2018-12-01 NOTE — Patient Instructions (Addendum)
Use at (ipratropium) nose spray 1 or 2 sprays each nostril 4 times daily as needed for drainage and head congestion.  Drink plenty of fluids  Continue your TheraFlu or other over-the-counter or symptom relievers.    This is all consistent with still being a bad cold virus.  Your sinuses are not that tender and I do not think you have a significant secondary bacterial infection at this time.  Viruses do not respond to antibiotics.  However, if you do not feel like it is continuing to improve by Saturday, then go ahead and get the prescription I am giving you for azithromycin filled.  If you have lab work done today you will be contacted with your lab results within the next 2 weeks.  If you have not heard from us then please contact us. The fastest way to get your results is to register for My Chart.   Upper Respiratory Infection, Adult An upper respiratory infection (URI) affects the nose, throat, and upper air passages. URIs are caused by germs (viruses). The most common type of URI is often called "the common cold." Medicines cannot cure URIs, but you can do things at home to relieve your symptoms. URIs usually get better within 7-10 days. Follow these instructions at home: Activity  Rest as needed.  If you have a fever, stay home from work or school until your fever is gone, or until your doctor says you may return to work or school. ? You should stay home until you cannot spread the infection anymore (you are not contagious). ? Your doctor may have you wear a face mask so you have less risk of spreading the infection. Relieving symptoms  Gargle with a salt-water mixture 3-4 times a day or as needed. To make a salt-water mixture, completely dissolve -1 tsp of salt in 1 cup of warm water.  Use a cool-mist humidifier to add moisture to the air. This can help you breathe more easily. Eating and drinking   Drink enough fluid to keep your pee (urine) pale yellow.  Eat soups and  other clear broths. General instructions   Take over-the-counter and prescription medicines only as told by your doctor. These include cold medicines, fever reducers, and cough suppressants.  Do not use any products that contain nicotine or tobacco. These include cigarettes and e-cigarettes. If you need help quitting, ask your doctor.  Avoid being where people are smoking (avoid secondhand smoke).  Make sure you get regular shots and get the flu shot every year.  Keep all follow-up visits as told by your doctor. This is important. How to avoid spreading infection to others   Wash your hands often with soap and water. If you do not have soap and water, use hand sanitizer.  Avoid touching your mouth, face, eyes, or nose.  Cough or sneeze into a tissue or your sleeve or elbow. Do not cough or sneeze into your hand or into the air. Contact a doctor if:  You are getting worse, not better.  You have any of these: ? A fever. ? Chills. ? Brown or red mucus in your nose. ? Yellow or brown fluid (discharge)coming from your nose. ? Pain in your face, especially when you bend forward. ? Swollen neck glands. ? Pain with swallowing. ? White areas in the back of your throat. Get help right away if:  You have shortness of breath that gets worse.  You have very bad or constant: ? Headache. ? Ear pain. ?  Pain in your forehead, behind your eyes, and over your cheekbones (sinus pain). ? Chest pain.  You have long-lasting (chronic) lung disease along with any of these: ? Wheezing. ? Long-lasting cough. ? Coughing up blood. ? A change in your usual mucus.  You have a stiff neck.  You have changes in your: ? Vision. ? Hearing. ? Thinking. ? Mood. Summary  An upper respiratory infection (URI) is caused by a germ called a virus. The most common type of URI is often called "the common cold."  URIs usually get better within 7-10 days.  Take over-the-counter and prescription  medicines only as told by your doctor. This information is not intended to replace advice given to you by your health care provider. Make sure you discuss any questions you have with your health care provider. Document Released: 05/18/2008 Document Revised: 07/23/2017 Document Reviewed: 07/23/2017 Elsevier Interactive Patient Education  2019 [employer]   IF you received an x-ray today, you will receive an invoice from [facility]. Please contact [facility] at [phone] with questions or concerns regarding your invoice.   IF you received labwork today, you will receive an invoice from [facility]. Please contact [facility] at [phone] with questions or concerns regarding your invoice.   Our billing staff will not be able to assist you with questions regarding bills from these companies.  You will be contacted with the lab results as soon as they are available. The fastest way to get your results is to activate your My Chart account. Instructions are located on the last page of this paperwork. If you have not heard from us regarding the results in 2 weeks, please contact this office.

## 2018-12-01 NOTE — Progress Notes (Signed)
Patient ID: Joyce PomfretWilma M Rasp, female    DOB: 07/29/1951  Age: 67 y.o. MRN: [id number]  Chief Complaint  Patient presents with  . Cough    having chills started saturday   . 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  She lives alone.  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  She has been ill for about 5 or 6 days.  She had no documented fever but she did have some chills.  She started as a bad cold and had head congestion.  Is gone from clear drainage to a yellowish and sometimes bloody drainage now.  She has not had any ear pain.  This throat felt a little discomfort but nothing bad.  She has been coughing a little bit but not enough to keep her awake at night.  Mostly her symptoms are the pressure and fullness sensation in her face.  She has tried various OTC medications and is on Xyzal for her allergies, but things have not seemed to help completely.  She has had some discomfort in her eyes, a little better today than it was yesterday.  Current allergies, medications, problem list, past/family and social histories reviewed.  Objective:  BP 135/68   Pulse 70   Temp 98.9 F (37.2 C) (Oral)   Resp 18   Ht 5\' 7"  (1.702 m)   Wt 152 lb 6.4 oz (69.1 kg)   SpO2 96%   BMI 23.87 kg/m   No major acute distress.  Her TMs are normal.  Eyes not injected.  Nose is a little congested but sinuses are surprisingly not particularly tender.  Throat is clear.  She has a prominent osteophyte in the roof of her mouth.  Supple without nodes or thyromegaly.  Chest is clear to auscultation.  Heart regular without murmur.  Assessment & Plan:   Assessment: 1. Acute upper respiratory infection       Plan: See instructions.  No orders of the defined types were placed in this encounter.   No orders of the defined types were placed in this encounter.        Patient Instructions     Use at (ipratropium) nose spray 1 or  2 sprays each nostril 4 times daily as needed for drainage and head congestion.  Drink plenty of fluids  Continue your TheraFlu or other over-the-counter or symptom relievers.    This is all consistent with still being a bad cold virus.  Your sinuses are not that tender and I do not think you have a significant secondary bacterial infection at this time.  Viruses do not respond to antibiotics.  However, if you do not feel like it is continuing to improve by Saturday, then go ahead and get the prescription I am giving you for azithromycin filled.  If you have lab work done today you will be contacted with your lab results within the next 2 weeks.  If you have not heard from us then please contact us. The fastest way to get your results is to register for My Chart.   Upper Respiratory Infection, Adult An upper respiratory infection (URI) affects the nose, throat, and upper air passages. URIs are caused by germs (viruses). The most common type of URI is often called "the common cold." Medicines cannot cure URIs, but you can do things at home to relieve your symptoms. URIs usually get better within 7-10 days.  Follow these instructions at home: Activity  Rest as needed.  If you have a fever, stay home from work or school until your fever is gone, or until your doctor says you may return to work or school. ? You should stay home until you cannot spread the infection anymore (you are not contagious). ? Your doctor may have you wear a face mask so you have less risk of spreading the infection. Relieving symptoms  Gargle with a salt-water mixture 3-4 times a day or as needed. To make a salt-water mixture, completely dissolve -1 tsp of salt in 1 cup of warm water.  Use a cool-mist humidifier to add moisture to the air. This can help you breathe more easily. Eating and drinking   Drink enough fluid to keep your pee (urine) pale yellow.  Eat soups and other clear broths. General  instructions   Take over-the-counter and prescription medicines only as told by your doctor. These include cold medicines, fever reducers, and cough suppressants.  Do not use any products that contain nicotine or tobacco. These include cigarettes and e-cigarettes. If you need help quitting, ask your doctor.  Avoid being where people are smoking (avoid secondhand smoke).  Make sure you get regular shots and get the flu shot every year.  Keep all follow-up visits as told by your doctor. This is important. How to avoid spreading infection to others   Wash your hands often with soap and water. If you do not have soap and water, use hand sanitizer.  Avoid touching your mouth, face, eyes, or nose.  Cough or sneeze into a tissue or your sleeve or elbow. Do not cough or sneeze into your hand or into the air. Contact a doctor if:  You are getting worse, not better.  You have any of these: ? A fever. ? Chills. ? Brown or red mucus in your nose. ? Yellow or brown fluid (discharge)coming from your nose. ? Pain in your face, especially when you bend forward. ? Swollen neck glands. ? Pain with swallowing. ? White areas in the back of your throat. Get help right away if:  You have shortness of breath that gets worse.  You have very bad or constant: ? Headache. ? Ear pain. ? Pain in your forehead, behind your eyes, and over your cheekbones (sinus pain). ? Chest pain.  You have long-lasting (chronic) lung disease along with any of these: ? Wheezing. ? Long-lasting cough. ? Coughing up blood. ? A change in your usual mucus.  You have a stiff neck.  You have changes in your: ? Vision. ? Hearing. ? Thinking. ? Mood. Summary  An upper respiratory infection (URI) is caused by a germ called a virus. The most common type of URI is often called "the common cold."  URIs usually get better within 7-10 days.  Take over-the-counter and prescription medicines only as told by your  doctor. This information is not intended to replace advice given to you by your health care provider. Make sure you discuss any questions you have with your health care provider. Document Released: 05/18/2008 Document Revised: 07/23/2017 Document Reviewed: 07/23/2017 Elsevier Interactive Patient Education  2019 [employer].   IF you received an x-ray today, you will receive an invoice from [facility]. Please contact [facility] at [phone] with questions or concerns regarding your invoice.   IF you received labwork today, you will receive an invoice from [facility]. Please contact [facility] at [phone] with questions or concerns regarding your invoice.  Our billing staff will not be able to assist you with questions regarding bills from these companies.  You will be contacted with the lab results as soon as they are available. The fastest way to get your results is to activate your My Chart account. Instructions are located on the last page of this paperwork. If you have not heard from us regarding the results in 2 weeks, please contact this office.        No follow-ups on file.   Janace Hoardavid Teri Legacy, MD 12/01/2018

## 2019-05-15 DIAGNOSIS — Z0000 Encounter for general adult medical examination without abnormal findings: Secondary | ICD-10-CM | POA: Diagnosis not present

## 2019-05-15 DIAGNOSIS — E7800 Pure hypercholesterolemia, unspecified: Secondary | ICD-10-CM | POA: Diagnosis not present

## 2019-05-15 DIAGNOSIS — Z8639 Personal history of other endocrine, nutritional and metabolic disease: Secondary | ICD-10-CM | POA: Diagnosis not present

## 2019-05-15 DIAGNOSIS — D72819 Decreased white blood cell count, unspecified: Secondary | ICD-10-CM | POA: Diagnosis not present

## 2019-05-15 DIAGNOSIS — I10 Essential (primary) hypertension: Secondary | ICD-10-CM | POA: Diagnosis not present

## 2019-05-16 ENCOUNTER — Other Ambulatory Visit: Payer: Self-pay | Admitting: Internal Medicine

## 2019-05-16 DIAGNOSIS — Z1231 Encounter for screening mammogram for malignant neoplasm of breast: Secondary | ICD-10-CM

## 2019-05-22 DIAGNOSIS — D72819 Decreased white blood cell count, unspecified: Secondary | ICD-10-CM | POA: Diagnosis not present

## 2019-05-22 DIAGNOSIS — I10 Essential (primary) hypertension: Secondary | ICD-10-CM | POA: Diagnosis not present

## 2019-05-22 DIAGNOSIS — M5431 Sciatica, right side: Secondary | ICD-10-CM | POA: Diagnosis not present

## 2019-05-22 DIAGNOSIS — Z0000 Encounter for general adult medical examination without abnormal findings: Secondary | ICD-10-CM | POA: Diagnosis not present

## 2019-05-22 DIAGNOSIS — E7800 Pure hypercholesterolemia, unspecified: Secondary | ICD-10-CM | POA: Diagnosis not present

## 2019-06-30 ENCOUNTER — Other Ambulatory Visit: Payer: Self-pay

## 2019-06-30 ENCOUNTER — Ambulatory Visit
Admission: RE | Admit: 2019-06-30 | Discharge: 2019-06-30 | Disposition: A | Discharge status: Home / Self Care | Payer: Medicare Other | Source: Ambulatory Visit | Attending: Internal Medicine | Admitting: Internal Medicine

## 2019-06-30 DIAGNOSIS — Z1231 Encounter for screening mammogram for malignant neoplasm of breast: Secondary | ICD-10-CM | POA: Diagnosis not present

## 2019-07-06 DIAGNOSIS — Z86010 Personal history of colonic polyps: Secondary | ICD-10-CM | POA: Diagnosis not present

## 2019-07-06 DIAGNOSIS — K648 Other hemorrhoids: Secondary | ICD-10-CM | POA: Diagnosis not present

## 2019-07-06 DIAGNOSIS — K5730 Diverticulosis of large intestine without perforation or abscess without bleeding: Secondary | ICD-10-CM | POA: Diagnosis not present

## 2019-07-17 DIAGNOSIS — E7800 Pure hypercholesterolemia, unspecified: Secondary | ICD-10-CM | POA: Diagnosis not present

## 2020-05-08 DIAGNOSIS — Z8639 Personal history of other endocrine, nutritional and metabolic disease: Secondary | ICD-10-CM | POA: Diagnosis not present

## 2020-05-08 DIAGNOSIS — E7800 Pure hypercholesterolemia, unspecified: Secondary | ICD-10-CM | POA: Diagnosis not present

## 2020-05-08 DIAGNOSIS — I10 Essential (primary) hypertension: Secondary | ICD-10-CM | POA: Diagnosis not present

## 2020-05-08 DIAGNOSIS — E559 Vitamin D deficiency, unspecified: Secondary | ICD-10-CM | POA: Diagnosis not present

## 2020-05-20 DIAGNOSIS — E7800 Pure hypercholesterolemia, unspecified: Secondary | ICD-10-CM | POA: Diagnosis not present

## 2020-05-20 DIAGNOSIS — Z0001 Encounter for general adult medical examination with abnormal findings: Secondary | ICD-10-CM | POA: Diagnosis not present

## 2020-05-20 DIAGNOSIS — I10 Essential (primary) hypertension: Secondary | ICD-10-CM | POA: Diagnosis not present

## 2020-05-20 DIAGNOSIS — E559 Vitamin D deficiency, unspecified: Secondary | ICD-10-CM | POA: Diagnosis not present

## 2020-05-20 DIAGNOSIS — M5431 Sciatica, right side: Secondary | ICD-10-CM | POA: Diagnosis not present

## 2020-10-07 ENCOUNTER — Other Ambulatory Visit: Payer: Self-pay | Admitting: Internal Medicine

## 2020-10-07 DIAGNOSIS — Z1231 Encounter for screening mammogram for malignant neoplasm of breast: Secondary | ICD-10-CM

## 2020-11-06 ENCOUNTER — Ambulatory Visit: Payer: Medicare Other

## 2020-12-19 ENCOUNTER — Ambulatory Visit: Payer: Medicare Other

## 2020-12-20 ENCOUNTER — Ambulatory Visit
Admission: RE | Admit: 2020-12-20 | Discharge: 2020-12-20 | Disposition: A | Discharge status: Home / Self Care | Payer: Medicare Other | Source: Ambulatory Visit | Attending: Internal Medicine | Admitting: Internal Medicine

## 2020-12-20 ENCOUNTER — Other Ambulatory Visit: Payer: Self-pay

## 2020-12-20 DIAGNOSIS — Z1231 Encounter for screening mammogram for malignant neoplasm of breast: Secondary | ICD-10-CM | POA: Diagnosis not present

## 2021-02-20 ENCOUNTER — Ambulatory Visit (INDEPENDENT_AMBULATORY_CARE_PROVIDER_SITE_OTHER): Payer: Medicare Other | Admitting: Family Medicine

## 2021-02-20 ENCOUNTER — Encounter: Payer: Self-pay | Admitting: Family Medicine

## 2021-02-20 ENCOUNTER — Other Ambulatory Visit: Payer: Self-pay

## 2021-02-20 VITALS — BP 151/78 | HR 79 | Temp 97.6°F | Ht 67.0 in | Wt 158.0 lb

## 2021-02-20 DIAGNOSIS — Z1321 Encounter for screening for nutritional disorder: Secondary | ICD-10-CM

## 2021-02-20 DIAGNOSIS — I10 Essential (primary) hypertension: Secondary | ICD-10-CM

## 2021-02-20 DIAGNOSIS — Z13228 Encounter for screening for other metabolic disorders: Secondary | ICD-10-CM | POA: Diagnosis not present

## 2021-02-20 DIAGNOSIS — Z130 Encounter for screening for diseases of the blood and blood-forming organs and certain disorders involving the immune mechanism: Secondary | ICD-10-CM

## 2021-02-20 DIAGNOSIS — E785 Hyperlipidemia, unspecified: Secondary | ICD-10-CM | POA: Diagnosis not present

## 2021-02-20 DIAGNOSIS — R109 Unspecified abdominal pain: Secondary | ICD-10-CM | POA: Diagnosis not present

## 2021-02-20 DIAGNOSIS — Z1329 Encounter for screening for other suspected endocrine disorder: Secondary | ICD-10-CM | POA: Diagnosis not present

## 2021-02-20 DIAGNOSIS — K5900 Constipation, unspecified: Secondary | ICD-10-CM

## 2021-02-20 LAB — CBC WITH DIFFERENTIAL/PLATELET: Platelets: 443 10*3/uL (ref 150–450)

## 2021-02-20 LAB — POCT URINALYSIS DIP (MANUAL ENTRY)
Glucose, UA: NEGATIVE mg/dL
Leukocytes, UA: NEGATIVE
Nitrite, UA: NEGATIVE
Protein Ur, POC: 100 mg/dL — AB
Spec Grav, UA: >=1.03 — AB (ref 1.010–1.025)
Urobilinogen, UA: 0.2 E.U./dL
pH, UA: 5.5 (ref 5.0–8.0)

## 2021-02-20 LAB — POCT WET + KOH PREP
Trich by wet prep: ABSENT
Yeast by KOH: ABSENT
Yeast by wet prep: ABSENT

## 2021-02-20 MED ORDER — POLYETHYLENE GLYCOL 3350 17 GM/SCOOP PO POWD: 17.0000 g | Freq: Two times a day (BID) | ORAL | 1 refills | Status: AC | PRN

## 2021-02-20 MED ORDER — DOCUSATE SODIUM 100 MG PO CAPS: 100.0000 mg | ORAL_CAPSULE | Freq: Two times a day (BID) | ORAL | 2 refills | Status: AC

## 2021-02-20 NOTE — Patient Instructions (Addendum)
Take 2 docusate (colace) in the AM and 2 in the evening Take 2 scoops of miralax in the AM and 2 in the evening Drink lots of fluids If no Bowel Movement by tomorrow add an over the counter laxative (Dulcolax) and take 2 of those  Once start having bowel movements transition to 1 scoop of miralax daily and 1 colace daily   Constipation, Adult Constipation is when a person has trouble pooping (having a bowel movement). When you have this condition, you may poop fewer than 3 times a week. Your poop (stool) may also be dry, hard, or bigger than normal. Follow these instructions at home: Eating and drinking  Eat foods that have a lot of fiber, such as: ? Fresh fruits and vegetables. ? Whole grains. ? Beans.  Eat less of foods that are low in fiber and high in fat and sugar, such as: ? [place] fries. ? Hamburgers. ? Cookies. ? Candy. ? Soda.  Drink enough fluid to keep your pee (urine) pale yellow.   General instructions  Exercise regularly or as told by your doctor. Try to do 150 minutes of exercise each week.  Go to the restroom when you feel like you need to poop. Do not hold it in.  Take over-the-counter and prescription medicines only as told by your doctor. These include any fiber supplements.  When you poop: ? Do deep breathing while relaxing your lower belly (abdomen). ? Relax your pelvic floor. The pelvic floor is a group of muscles that support the rectum, bladder, and intestines (as well as the uterus in women).  Watch your condition for any changes. Tell your doctor if you notice any.  Keep all follow-up visits as told by your doctor. This is important. Contact a doctor if:  You have pain that gets worse.  You have a fever.  You have not pooped for 4 days.  You vomit.  You are not hungry.  You lose weight.  You are bleeding from the opening of the butt (anus).  You have thin, pencil-like poop. Get help right away if:  You have a fever, and your  symptoms suddenly get worse.  You leak poop or have blood in your poop.  Your belly feels hard or bigger than normal (bloated).  You have very bad belly pain.  You feel dizzy or you faint. Summary  Constipation is when a person poops fewer than 3 times a week, has trouble pooping, or has poop that is dry, hard, or bigger than normal.  Eat foods that have a lot of fiber.  Drink enough fluid to keep your pee (urine) pale yellow.  Take over-the-counter and prescription medicines only as told by your doctor. These include any fiber supplements. This information is not intended to replace advice given to you by your health care provider. Make sure you discuss any questions you have with your health care provider. Document Revised: 10/18/2019 Document Reviewed: 10/18/2019 Elsevier Patient Education  2021 [employer].    If you have lab work done today you will be contacted with your lab results within the next 2 weeks.  If you have not heard from [place] then please contact us. The fastest way to get your results is to register for My Chart.   IF you received an x-ray today, you will receive an invoice from [facility]. Please contact [facility] at [phone] with questions or concerns regarding your invoice.   IF you received labwork today, you will receive an invoice  from [facility]. Please contact [facility] at [phone] with questions or concerns regarding your invoice.   Our billing staff will not be able to assist you with questions regarding bills from these companies.  You will be contacted with the lab results as soon as they are available. The fastest way to get your results is to activate your My Chart account. Instructions are located on the last page of this paperwork. If you have not heard from [place] regarding the results in 2 weeks, please contact this office.

## 2021-02-20 NOTE — Progress Notes (Signed)
3/10/202210:35 AM  Joyce Price Mar 16, 1951, 70 y.o., female [id number]  Chief Complaint  Patient presents with  . Abdominal Pain    Vomiting , last bm 2 days ago- normally 3 bm per week     HPI:   Patient is a 70 y.o. female with past medical history significant for HTN, HLD who presents today for abdominal pain.  Has noticed mid abdominal pain since Tuesday Took tylenol for this, resolved the pain and then returned Pain returned after Tried pepto, worked for a short time then returned Vomited last night, then started dry heaving This morning was vomiting dark brown Now stomach feels better Didn't eat anything this morning Didn't take medications this morning This has never happened in the past Hasn't had a BM since Tuesday Eat fiber cereals to regulate Has not taken anything for constipation Hurts right around umbilicus Describes as pins and needles type pain When has pain it stays for awhile Feels bloated Happens out of nowhere, not associated with food Has not made any changes to diet 25 years ago had surgery for a bowel obstruction  HTN Didn't take medications this morning BP Readings from Last 3 Encounters:  02/20/21 (!) 151/78  12/01/18 135/68  07/12/18 137/81     Depression screen PHQ 2/9 02/20/2021 12/01/2018 07/12/2018  Decreased Interest 0 0 0  Down, Depressed, Hopeless 0 0 0  PHQ - 2 Score 0 0 0    Fall Risk  02/20/2021 12/01/2018 07/12/2018 02/26/2018 07/10/2017  Falls in the past year? 0 0 No No No  Number falls in past yr: 0 - - - -  Injury with Fall? 0 - - - -  Follow up Falls evaluation completed - - - -     No Known Allergies  Prior to Admission medications   Medication Sig Start Date End Date Taking? Authorizing Provider  aspirin 81 MG tablet Take 81 mg by mouth daily.    [provider]  azithromycin (ZITHROMAX) 250 MG tablet Take 2 tabs PO x 1 dose, then 1 tab PO QD x 4 days 12/01/18   Posey Boyer, MD  calcium  citrate-vitamin D (CITRACAL+D) 315-200 MG-UNIT per tablet Take 1 tablet by mouth 2 (two) times daily.    [provider]  ipratropium (ATROVENT) 0.03 % nasal spray Place 2 sprays into both nostrils 4 (four) times daily. 12/01/18   Posey Boyer, MD  levocetirizine (XYZAL) 5 MG tablet Take 1 tablet (5 mg total) by mouth every evening. 02/26/18   McVey, Gelene Mink, PA-C  olmesartan-hydrochlorothiazide (BENICAR HCT) 40-25 MG tablet Take 1 tablet by mouth daily.    [provider]  rosuvastatin (CRESTOR) 20 MG tablet  06/03/17   [provider]  telmisartan-hydrochlorothiazide (MICARDIS HCT) 80-25 MG per tablet Take 1 tablet by mouth daily.    [provider]    Past Medical History:  Diagnosis Date  . Hypertension     History reviewed. No pertinent surgical history.  Social History   Tobacco Use  . Smoking status: Never Smoker  . Smokeless tobacco: Never Used  Substance Use Topics  . Alcohol use: Yes    Alcohol/week: 1.0 standard drink    Types: 1 Glasses of wine per week    Family History  Problem Relation Age of Onset  . Cancer Father   . Hypertension Sister   . Hypertension Brother     Review of Systems  Constitutional: Negative for chills, fever and malaise/fatigue.  Eyes: Negative for  blurred vision and double vision.  Respiratory: Negative for cough, shortness of breath and wheezing.   Cardiovascular: Negative for chest pain, palpitations and leg swelling.  Gastrointestinal: Positive for abdominal pain, constipation and vomiting. Negative for blood in stool, diarrhea, heartburn and nausea.  Genitourinary: Negative for dysuria, flank pain, frequency, hematuria and urgency.  Musculoskeletal: Negative for back pain and myalgias.  Skin: Negative for rash.  Neurological: Negative for dizziness, weakness and headaches.     OBJECTIVE:  Today's Vitals   02/20/21 0959  BP: (!) 151/78  Pulse: 79  Temp: 97.6 F (36.4 C)  SpO2:  96%  Weight: 158 lb (71.7 kg)  Height: '5\' 7"'  (1.702 m)   Body mass index is 24.75 kg/m.   Physical Exam Constitutional:      General: She is not in acute distress.    Appearance: Normal appearance. She is not ill-appearing.  HENT:     Head: Normocephalic.  Cardiovascular:     Rate and Rhythm: Normal rate and regular rhythm.     Pulses: Normal pulses.     Heart sounds: Normal heart sounds. No murmur heard. No friction rub. No gallop.   Pulmonary:     Effort: Pulmonary effort is normal. No respiratory distress.     Breath sounds: Normal breath sounds. No stridor. No wheezing, rhonchi or rales.  Abdominal:     General: Bowel sounds are normal. There is no distension.     Palpations: Abdomen is soft. There is no mass.     Tenderness: There is no abdominal tenderness. There is no right CVA tenderness, left CVA tenderness or guarding.  Musculoskeletal:     Right lower leg: No edema.     Left lower leg: No edema.  Skin:    General: Skin is warm and dry.  Neurological:     Mental Status: She is alert and oriented to person, place, and time.  Psychiatric:        Mood and Affect: Mood normal.        Behavior: Behavior normal.     Results for orders placed or performed in visit on 02/20/21 (from the past 24 hour(s))  POCT Wet + KOH Prep     Status: Abnormal   Collection Time: 02/20/21 10:11 AM  Result Value Ref Range   Yeast by KOH Absent Absent   Yeast by wet prep Absent Absent   WBC by wet prep Moderate (A) Few   Clue Cells Wet Prep HPF POC Few (A) None   Trich by wet prep Absent Absent   Bacteria Wet Prep HPF POC Few Few   Epithelial Cells By [employer] ([facility]) Few None, Few, Too numerous to count   RBC,UR,HPF,POC None None RBC/hpf  POCT urinalysis dipstick     Status: Abnormal   Collection Time: 02/20/21 10:12 AM  Result Value Ref Range   Color, UA yellow yellow   Clarity, UA clear clear   Glucose, UA negative negative mg/dL   Bilirubin, UA small (A) negative    Ketones, POC UA small (15) (A) negative mg/dL   Spec Grav, UA >=1.030 (A) 1.010 - 1.025   Blood, UA small (A) negative   pH, UA 5.5 5.0 - 8.0   Protein Ur, POC =100 (A) negative mg/dL   Urobilinogen, UA 0.2 0.2 or 1.0 E.U./dL   Nitrite, UA Negative Negative   Leukocytes, UA Negative Negative    No results found.   ASSESSMENT and PLAN  Problem List Items Addressed This Visit  Cardiovascular and Mediastinum   Benign essential HTN   Relevant Orders   CMP14+EGFR     Other   Hyperlipidemia    Other Visit Diagnoses    Abdominal pain, unspecified abdominal location    -  Primary   Relevant Orders   POCT Wet + KOH Prep (Completed)   POCT urinalysis dipstick (Completed)   Amylase   Lipid Panel   Screening for endocrine, nutritional, metabolic and immunity disorder       Relevant Orders   CBC with Differential   Hemoglobin A1c   TSH   Vitamin D, 25-hydroxy   Constipation, unspecified constipation type       Relevant Medications   polyethylene glycol powder (GLYCOLAX/MIRALAX) 17 GM/SCOOP powder   docusate sodium (COLACE) 100 MG capsule       Plan  Take 2 docusate (colace) in the AM and 2 in the evening  Take 2 scoops of miralax in the AM and 2 in the evening  Drink lots of fluids  If no Bowel Movement by tomorrow add an over the counter laxative (Dulcolax) and take 2 of those  Once start having bowel movements transition to 1 scoop of miralax daily and 1 colace daily  Strict RTC/ ED precautions provided  Discussed OTC treatments  Discussed r/se/b of medications  Increase fluids as able.   Return in about 3 months (around 05/23/2021).    Huston Foley Miyah Hampshire, FNP-BC [facility] [place], [place] [address] Ph.  [phone] Fax [phone]

## 2021-02-21 LAB — CBC WITH DIFFERENTIAL/PLATELET
Basophils Absolute: 0 10*3/uL (ref 0.0–0.2)
Basos: 0 %
EOS (ABSOLUTE): 0 10*3/uL (ref 0.0–0.4)
Eos: 0 %
Hematocrit: 47.9 % — ABNORMAL HIGH (ref 34.0–46.6)
Hemoglobin: 16.3 g/dL — ABNORMAL HIGH (ref 11.1–15.9)
Immature Grans (Abs): 0 10*3/uL (ref 0.0–0.1)
Immature Granulocytes: 0 %
Lymphocytes Absolute: 1 10*3/uL (ref 0.7–3.1)
Lymphs: 7 %
MCH: 29.9 pg (ref 26.6–33.0)
MCHC: 34 g/dL (ref 31.5–35.7)
MCV: 88 fL (ref 79–97)
Monocytes Absolute: 0.7 10*3/uL (ref 0.1–0.9)
Monocytes: 5 %
Neutrophils Absolute: 12.7 10*3/uL — ABNORMAL HIGH (ref 1.4–7.0)
Neutrophils: 88 %
RBC: 5.46 x10E6/uL — ABNORMAL HIGH (ref 3.77–5.28)
RDW: 13.2 % (ref 11.7–15.4)
WBC: 14.6 10*3/uL — ABNORMAL HIGH (ref 3.4–10.8)

## 2021-02-21 LAB — CMP14+EGFR
ALT: 20 IU/L (ref 0–32)
AST: 29 IU/L (ref 0–40)
Albumin/Globulin Ratio: 1.3 (ref 1.2–2.2)
Albumin: 4.6 g/dL (ref 3.8–4.8)
Alkaline Phosphatase: 82 IU/L (ref 44–121)
BUN/Creatinine Ratio: 19 (ref 12–28)
BUN: 21 mg/dL (ref 8–27)
Bilirubin Total: 1 mg/dL (ref 0.0–1.2)
CO2: 25 mmol/L (ref 20–29)
Calcium: 10.4 mg/dL — ABNORMAL HIGH (ref 8.7–10.3)
Chloride: 93 mmol/L — ABNORMAL LOW (ref 96–106)
Creatinine, Ser: 1.09 mg/dL — ABNORMAL HIGH (ref 0.57–1.00)
Globulin, Total: 3.5 g/dL (ref 1.5–4.5)
Glucose: 110 mg/dL — ABNORMAL HIGH (ref 65–99)
Potassium: 3.8 mmol/L (ref 3.5–5.2)
Sodium: 136 mmol/L (ref 134–144)
Total Protein: 8.1 g/dL (ref 6.0–8.5)
eGFR: 55 mL/min/{1.73_m2} — ABNORMAL LOW (ref >59)

## 2021-02-21 LAB — AMYLASE: Amylase: 63 U/L (ref 31–110)

## 2021-02-21 LAB — HEMOGLOBIN A1C
Est. average glucose Bld gHb Est-mCnc: 117 mg/dL
Hgb A1c MFr Bld: 5.7 % — ABNORMAL HIGH (ref 4.8–5.6)

## 2021-02-21 LAB — LIPID PANEL
Chol/HDL Ratio: 3.5 ratio (ref 0.0–4.4)
Cholesterol, Total: 330 mg/dL — ABNORMAL HIGH (ref 100–199)
HDL: 95 mg/dL (ref >39)
LDL Chol Calc (NIH): 223 mg/dL — ABNORMAL HIGH (ref 0–99)
Triglycerides: 79 mg/dL (ref 0–149)
VLDL Cholesterol Cal: 12 mg/dL (ref 5–40)

## 2021-02-21 LAB — TSH: TSH: 0.409 u[IU]/mL — ABNORMAL LOW (ref 0.450–4.500)

## 2021-02-21 LAB — VITAMIN D 25 HYDROXY (VIT D DEFICIENCY, FRACTURES): Vit D, 25-Hydroxy: 48.6 ng/mL (ref 30.0–100.0)

## 2021-02-22 ENCOUNTER — Other Ambulatory Visit: Payer: Self-pay | Admitting: Family Medicine

## 2021-02-22 ENCOUNTER — Emergency Department (HOSPITAL_COMMUNITY): Payer: Medicare Other

## 2021-02-22 ENCOUNTER — Inpatient Hospital Stay (HOSPITAL_COMMUNITY)
Admission: EM | Admit: 2021-02-22 | Discharge: 2021-02-27 | DRG: 390 | Disposition: A | Discharge status: Home / Self Care | Payer: Medicare Other | Attending: Family Medicine | Admitting: Family Medicine

## 2021-02-22 ENCOUNTER — Encounter (HOSPITAL_COMMUNITY): Payer: Self-pay | Admitting: Emergency Medicine

## 2021-02-22 ENCOUNTER — Other Ambulatory Visit: Payer: Self-pay

## 2021-02-22 DIAGNOSIS — E785 Hyperlipidemia, unspecified: Secondary | ICD-10-CM | POA: Diagnosis present

## 2021-02-22 DIAGNOSIS — Z888 Allergy status to other drugs, medicaments and biological substances status: Secondary | ICD-10-CM

## 2021-02-22 DIAGNOSIS — I10 Essential (primary) hypertension: Secondary | ICD-10-CM

## 2021-02-22 DIAGNOSIS — Z79899 Other long term (current) drug therapy: Secondary | ICD-10-CM

## 2021-02-22 DIAGNOSIS — K56600 Partial intestinal obstruction, unspecified as to cause: Secondary | ICD-10-CM | POA: Diagnosis not present

## 2021-02-22 DIAGNOSIS — K5651 Intestinal adhesions [bands], with partial obstruction: Secondary | ICD-10-CM | POA: Diagnosis not present

## 2021-02-22 DIAGNOSIS — E7800 Pure hypercholesterolemia, unspecified: Secondary | ICD-10-CM | POA: Diagnosis not present

## 2021-02-22 DIAGNOSIS — K5669 Other partial intestinal obstruction: Secondary | ICD-10-CM | POA: Diagnosis not present

## 2021-02-22 DIAGNOSIS — Z0189 Encounter for other specified special examinations: Secondary | ICD-10-CM

## 2021-02-22 DIAGNOSIS — Z4682 Encounter for fitting and adjustment of non-vascular catheter: Secondary | ICD-10-CM | POA: Diagnosis not present

## 2021-02-22 DIAGNOSIS — Z8249 Family history of ischemic heart disease and other diseases of the circulatory system: Secondary | ICD-10-CM

## 2021-02-22 DIAGNOSIS — Z7982 Long term (current) use of aspirin: Secondary | ICD-10-CM | POA: Diagnosis not present

## 2021-02-22 DIAGNOSIS — K6389 Other specified diseases of intestine: Secondary | ICD-10-CM | POA: Diagnosis not present

## 2021-02-22 DIAGNOSIS — Z20822 Contact with and (suspected) exposure to covid-19: Secondary | ICD-10-CM | POA: Diagnosis present

## 2021-02-22 DIAGNOSIS — K56609 Unspecified intestinal obstruction, unspecified as to partial versus complete obstruction: Secondary | ICD-10-CM

## 2021-02-22 DIAGNOSIS — R1084 Generalized abdominal pain: Secondary | ICD-10-CM | POA: Diagnosis present

## 2021-02-22 DIAGNOSIS — K565 Intestinal adhesions [bands], unspecified as to partial versus complete obstruction: Secondary | ICD-10-CM

## 2021-02-22 DIAGNOSIS — K5732 Diverticulitis of large intestine without perforation or abscess without bleeding: Secondary | ICD-10-CM

## 2021-02-22 DIAGNOSIS — R140 Abdominal distension (gaseous): Secondary | ICD-10-CM | POA: Diagnosis not present

## 2021-02-22 LAB — CBC
HCT: 43.7 % (ref 36.0–46.0)
Hemoglobin: 15 g/dL (ref 12.0–15.0)
MCH: 30.4 pg (ref 26.0–34.0)
MCHC: 34.3 g/dL (ref 30.0–36.0)
MCV: 88.5 fL (ref 80.0–100.0)
Platelets: 383 10*3/uL (ref 150–400)
RBC: 4.94 MIL/uL (ref 3.87–5.11)
RDW: 12.8 % (ref 11.5–15.5)
WBC: 9.2 10*3/uL (ref 4.0–10.5)
nRBC: 0 % (ref 0.0–0.2)

## 2021-02-22 LAB — COMPREHENSIVE METABOLIC PANEL
ALT: 30 U/L (ref 0–44)
AST: 34 U/L (ref 15–41)
Albumin: 4 g/dL (ref 3.5–5.0)
Alkaline Phosphatase: 57 U/L (ref 38–126)
Anion gap: 14 (ref 5–15)
BUN: 27 mg/dL — ABNORMAL HIGH (ref 8–23)
CO2: 27 mmol/L (ref 22–32)
Calcium: 9.7 mg/dL (ref 8.9–10.3)
Chloride: 94 mmol/L — ABNORMAL LOW (ref 98–111)
Creatinine, Ser: 1.03 mg/dL — ABNORMAL HIGH (ref 0.44–1.00)
GFR, Estimated: 59 mL/min — ABNORMAL LOW (ref >60)
Glucose, Bld: 118 mg/dL — ABNORMAL HIGH (ref 70–99)
Potassium: 3.4 mmol/L — ABNORMAL LOW (ref 3.5–5.1)
Sodium: 135 mmol/L (ref 135–145)
Total Bilirubin: 1.6 mg/dL — ABNORMAL HIGH (ref 0.3–1.2)
Total Protein: 8.1 g/dL (ref 6.5–8.1)

## 2021-02-22 LAB — LIPASE, BLOOD: Lipase: 20 U/L (ref 11–51)

## 2021-02-22 MED ORDER — ROSUVASTATIN CALCIUM 20 MG PO TABS
20.0000 mg | ORAL_TABLET | Freq: Every day | ORAL | 3 refills | Status: DC
Start: 2021-02-22 — End: 2021-02-27

## 2021-02-22 MED ORDER — MORPHINE SULFATE (PF) 2 MG/ML IV SOLN: 2.0000 mg | INTRAVENOUS | Status: DC | PRN

## 2021-02-22 MED ORDER — METOPROLOL TARTRATE 5 MG/5ML IV SOLN
5.0000 mg | Freq: Four times a day (QID) | INTRAVENOUS | Status: DC | PRN
  Filled 2021-02-22: qty 5

## 2021-02-22 MED ORDER — ONDANSETRON HCL 4 MG/2ML IJ SOLN
4.0000 mg | Freq: Once | INTRAMUSCULAR | Status: AC
  Administered 2021-02-22: 4 mg via INTRAVENOUS
  Filled 2021-02-22: qty 2

## 2021-02-22 MED ORDER — AMOXICILLIN-POT CLAVULANATE 875-125 MG PO TABS: 1.0000 | ORAL_TABLET | Freq: Three times a day (TID) | ORAL | 0 refills | Status: DC

## 2021-02-22 MED ORDER — LACTATED RINGERS IV SOLN: INTRAVENOUS | Status: AC

## 2021-02-22 MED ORDER — ENOXAPARIN SODIUM 40 MG/0.4ML ~~LOC~~ SOLN
40.0000 mg | Freq: Every day | SUBCUTANEOUS | Status: DC
  Administered 2021-02-23 – 2021-02-27 (×5): 40 mg via SUBCUTANEOUS
  Filled 2021-02-22 (×4): qty 0.4

## 2021-02-22 MED ORDER — POLYETHYLENE GLYCOL 3350 17 G PO PACK: 17.0000 g | PACK | Freq: Every day | ORAL | Status: DC | PRN

## 2021-02-22 MED ORDER — SODIUM CHLORIDE 0.9 % IV BOLUS
1000.0000 mL | Freq: Once | INTRAVENOUS | Status: AC
  Administered 2021-02-22: 1000 mL via INTRAVENOUS

## 2021-02-22 MED ORDER — ONDANSETRON HCL 4 MG/2ML IJ SOLN
4.0000 mg | Freq: Four times a day (QID) | INTRAMUSCULAR | Status: DC | PRN
  Administered 2021-02-22 – 2021-02-23 (×2): 4 mg via INTRAVENOUS
  Filled 2021-02-22 (×2): qty 2

## 2021-02-22 MED ORDER — IOHEXOL 300 MG/ML  SOLN
100.0000 mL | Freq: Once | INTRAMUSCULAR | Status: AC | PRN
  Administered 2021-02-22: 100 mL via INTRAVENOUS

## 2021-02-22 MED ORDER — ONDANSETRON HCL 4 MG PO TABS: 4.0000 mg | ORAL_TABLET | Freq: Four times a day (QID) | ORAL | Status: DC | PRN

## 2021-02-22 NOTE — ED Notes (Signed)
ED TO INPATIENT HANDOFF REPORT  Name/Age/Gender Joyce Price 70 y.o. female  Code Status   Home/SNF/Other Home  Chief Complaint SBO (small bowel obstruction) (HCC) [K56.609]  Level of Care/Admitting Diagnosis ED Disposition    ED Disposition Condition Comment   Admit  Hospital Area: [facility] [[id number]]  Level of Care: Med-Surg [16]  Covid Evaluation: Asymptomatic Screening Protocol (No Symptoms)  Diagnosis: SBO (small bowel obstruction) [facility]) [482500]  Admitting Physician: [name] [[id number]]  Attending Physician: [name] [[id number]]       Medical History Past Medical History:  Diagnosis Date  . Hypertension     Allergies No Known Allergies  IV Location/Drains/Wounds Patient Lines/Drains/Airways Status    Active Line/Drains/Airways    Name Placement date Placement time Site Days   Peripheral IV 02/22/21 Right Antecubital 02/22/21  1421  Antecubital  less than 1          Labs/Imaging Results for orders placed or performed during the hospital encounter of 02/22/21 (from the past 48 hour(s))  Lipase, blood     Status: None   Collection Time: 02/22/21  1:05 PM  Result Value Ref Range   Lipase 20 11 - 51 U/L    Comment: Performed at [facility], [address] [place], [place] [address]  Comprehensive metabolic panel     Status: Abnormal   Collection Time: 02/22/21  1:05 PM  Result Value Ref Range   Sodium 135 135 - 145 mmol/L   Potassium 3.4 (L) 3.5 - 5.1 mmol/L   Chloride 94 (L) 98 - 111 mmol/L   CO2 27 22 - 32 mmol/L   Glucose, Bld 118 (H) 70 - 99 mg/dL    Comment: Glucose reference range applies only to samples taken after fasting for at least 8 hours.   BUN 27 (H) 8 - 23 mg/dL   Creatinine, Ser 8.00 (H) 0.44 - 1.00 mg/dL   Calcium 9.7 8.9 - 34.9 mg/dL   Total Protein 8.1 6.5 - 8.1 g/dL   Albumin 4.0 3.5 - 5.0 g/dL   AST 34 15 - 41 U/L   ALT 30 0 - 44 U/L   Alkaline Phosphatase 57 38 - 126 U/L    Total Bilirubin 1.6 (H) 0.3 - 1.2 mg/dL   GFR, Estimated 59 (L) >60 mL/min    Comment: (NOTE) Calculated using the CKD-EPI Creatinine Equation (2021)    Anion gap 14 5 - 15    Comment: Performed at [facility], [address] [place], [place] [address]  CBC     Status: None   Collection Time: 02/22/21  1:05 PM  Result Value Ref Range   WBC 9.2 4.0 - 10.5 K/uL   RBC 4.94 3.87 - 5.11 MIL/uL   Hemoglobin 15.0 12.0 - 15.0 g/dL   HCT 05.6 97.9 - 48.0 %   MCV 88.5 80.0 - 100.0 fL   MCH 30.4 26.0 - 34.0 pg   MCHC 34.3 30.0 - 36.0 g/dL   RDW 16.5 53.7 - 48.2 %   Platelets 383 150 - 400 K/uL   nRBC 0.0 0.0 - 0.2 %    Comment: Performed at [facility], [address], [place], [place] [address]   CT Abdomen Pelvis W Contrast  Result Date: 02/22/2021 CLINICAL DATA:  History of bowel adhesions, obstruction 25 years ago. Nausea, abdominal discomfort EXAM: CT ABDOMEN AND PELVIS WITH CONTRAST TECHNIQUE: Multidetector CT imaging of the abdomen and pelvis was performed using  the standard protocol following bolus administration of intravenous contrast. CONTRAST:  OMNIPAQUE IOHEXOL 300 MG/ML  SOLN COMPARISON:  None. FINDINGS: Lower chest: No acute abnormality. Hepatobiliary: No solid liver abnormality is seen. No gallstones, gallbladder wall thickening, or biliary dilatation. Pancreas: Unremarkable. No pancreatic ductal dilatation or surrounding inflammatory changes. Spleen: Normal in size without significant abnormality. Adrenals/Urinary Tract: Adrenal glands are unremarkable. Kidneys are normal, without renal calculi, solid lesion, or hydronephrosis. Bladder is unremarkable. Stomach/Bowel: Stomach is within normal limits. Appendix appears normal. The mid to distal small bowel is mildly distended and fluid-filled, largest loops measuring up to 3.6 cm. There is an abrupt caliber change in the right lower quadrant, with decompression of the remaining loops of  terminal ileum (series 2, image 54, series 5, image 62). There is stool and scattered gas present in the colon. Vascular/Lymphatic: No significant vascular findings are present. No enlarged abdominal or pelvic lymph nodes. Reproductive: Calcified uterine fibroids. Other: No abdominal wall hernia or abnormality. No abdominopelvic ascites. Musculoskeletal: No acute or significant osseous findings. IMPRESSION: The mid to distal small bowel is mildly distended and fluid-filled, largest loops measuring up to 3.6 cm. There is an abrupt caliber change in the right lower quadrant, with decompression of the remaining loops of terminal ileum. Findings are consistent with at least partial small bowel obstruction secondary to adhesion or stricture. Electronically Signed   By: Lauralyn Primes M.D.   On: 02/22/2021 15:02    Pending Labs Unresulted Labs (From admission, onward)          Start     Ordered   02/22/21 1303  Urinalysis, Routine w reflex microscopic  ONCE - STAT,   STAT        02/22/21 1302          Vitals/Pain Today's Vitals   02/22/21 1302 02/22/21 1426  BP: (!) 149/91 140/90  Pulse: 79 67  Resp: 18 15  Temp: (!) 97.4 F (36.3 C)   SpO2: 98% 99%    Isolation Precautions No active isolations  Medications Medications  ondansetron (ZOFRAN) injection 4 mg (4 mg Intravenous Given 02/22/21 1421)  sodium chloride 0.9 % bolus 1,000 mL (1,000 mLs Intravenous New Bag/Given (Non-Interop) 02/22/21 1422)  iohexol (OMNIPAQUE) 300 MG/ML solution 100 mL (100 mLs Intravenous Contrast Given 02/22/21 1446)    Mobility walks

## 2021-02-22 NOTE — ED Provider Notes (Signed)
[facility]-EMERGENCY DEPT Provider Note   CSN: [id number] Arrival date & time: 02/22/21  1231     History Chief Complaint  Patient presents with  . Abdominal Pain  . Emesis    Joyce Price is a 70 y.o. female.  The history is provided by the patient.  Abdominal Pain Pain location:  Generalized Pain quality: not sharp   Pain radiates to:  Does not radiate Pain severity:  Mild Onset quality:  Gradual Duration:  5 days Timing:  Constant Progression:  Worsening Chronicity:  New Context: previous surgery   Relieved by:  Nothing Worsened by:  Eating Ineffective treatments: colace and miralax. Associated symptoms: nausea and vomiting   Associated symptoms: no chest pain, no chills, no cough, no diarrhea, no dysuria, no fever, no hematuria, no shortness of breath and no sore throat   Emesis Associated symptoms: abdominal pain   Associated symptoms: no arthralgias, no chills, no cough, no diarrhea, no fever and no sore throat        Past Medical History:  Diagnosis Date  . Hypertension     Patient Active Problem List   Diagnosis Date Noted  . Allergic rhinitis 02/26/2018  . Benign essential HTN 06/07/2017  . Hyperlipidemia 06/07/2017    History reviewed. No pertinent surgical history.   OB History   No obstetric history on file.     Family History  Problem Relation Age of Onset  . Cancer Father   . Hypertension Sister   . Hypertension Brother     Social History   Tobacco Use  . Smoking status: Never Smoker  . Smokeless tobacco: Never Used  Substance Use Topics  . Alcohol use: Yes    Alcohol/week: 1.0 standard drink    Types: 1 Glasses of wine per week  . Drug use: No    Home Medications Prior to Admission medications   Medication Sig Start Date End Date Taking? Authorizing Provider  amoxicillin-clavulanate (AUGMENTIN) 875-125 MG tablet Take 1 tablet by mouth in the morning, at noon, and at bedtime for 7 days. 02/22/21  03/01/21  Just, Azalee Course, FNP  aspirin 81 MG tablet Take 81 mg by mouth daily.    [provider]  calcium citrate-vitamin D (CITRACAL+D) 315-200 MG-UNIT per tablet Take 1 tablet by mouth 2 (two) times daily.    [provider]  docusate sodium (COLACE) 100 MG capsule Take 1-2 capsules (100-200 mg total) by mouth 2 (two) times daily. 02/20/21   Just, Azalee Course, FNP  ipratropium (ATROVENT) 0.03 % nasal spray Place 2 sprays into both nostrils 4 (four) times daily. 12/01/18   Peyton Najjar, MD  levocetirizine (XYZAL) 5 MG tablet Take 1 tablet (5 mg total) by mouth every evening. 02/26/18   McVey, Madelaine Bhat, PA-C  olmesartan-hydrochlorothiazide (BENICAR HCT) 40-25 MG tablet Take 1 tablet by mouth daily.    [provider]  polyethylene glycol powder (GLYCOLAX/MIRALAX) 17 GM/SCOOP powder Take 17 g by mouth 2 (two) times daily as needed. 02/20/21   Just, Azalee Course, FNP  rosuvastatin (CRESTOR) 20 MG tablet Take 1 tablet (20 mg total) by mouth daily. 02/22/21   Just, Azalee Course, FNP    Allergies    Patient has no known allergies.  Review of Systems   Review of Systems  Constitutional: Negative for chills and fever.  HENT: Negative for ear pain and sore throat.   Eyes: Negative for pain and visual disturbance.  Respiratory: Negative for cough and shortness of breath.  Cardiovascular: Negative for chest pain and palpitations.  Gastrointestinal: Positive for abdominal pain, nausea and vomiting. Negative for diarrhea.  Genitourinary: Negative for dysuria and hematuria.  Musculoskeletal: Negative for arthralgias and back pain.  Skin: Negative for color change and rash.  Neurological: Negative for seizures and syncope.  All other systems reviewed and are negative.   Physical Exam Updated Vital Signs BP (!) 149/91 (BP Location: Left Arm)   Pulse 79   Temp (!) 97.4 F (36.3 C)   Resp 18   SpO2 98%   Physical Exam Vitals and nursing note reviewed.  Constitutional:       General: She is not in acute distress.    Appearance: She is well-developed.  HENT:     Head: Normocephalic and atraumatic.  Eyes:     Conjunctiva/sclera: Conjunctivae normal.  Cardiovascular:     Rate and Rhythm: Normal rate and regular rhythm.     Heart sounds: No murmur heard.   Pulmonary:     Effort: Pulmonary effort is normal. No respiratory distress.     Breath sounds: Normal breath sounds.  Abdominal:     General: Bowel sounds are decreased. There is distension.     Palpations: Abdomen is soft.     Tenderness: There is generalized abdominal tenderness.  Musculoskeletal:     Cervical back: Neck supple.  Skin:    General: Skin is warm and dry.  Neurological:     General: No focal deficit present.     Mental Status: She is alert.  Psychiatric:        Mood and Affect: Mood normal.     ED Results / Procedures / Treatments   Labs (all labs ordered are listed, but only abnormal results are displayed) Labs Reviewed  COMPREHENSIVE METABOLIC PANEL - Abnormal; Notable for the following components:      Result Value   Potassium 3.4 (*)    Chloride 94 (*)    Glucose, Bld 118 (*)    BUN 27 (*)    Creatinine, Ser 1.03 (*)    Total Bilirubin 1.6 (*)    GFR, Estimated 59 (*)    All other components within normal limits  LIPASE, BLOOD  CBC  URINALYSIS, ROUTINE W REFLEX MICROSCOPIC    EKG None  Radiology CT Abdomen Pelvis W Contrast  Result Date: 02/22/2021 CLINICAL DATA:  History of bowel adhesions, obstruction 25 years ago. Nausea, abdominal discomfort EXAM: CT ABDOMEN AND PELVIS WITH CONTRAST TECHNIQUE: Multidetector CT imaging of the abdomen and pelvis was performed using the standard protocol following bolus administration of intravenous contrast. CONTRAST:  OMNIPAQUE IOHEXOL 300 MG/ML  SOLN COMPARISON:  None. FINDINGS: Lower chest: No acute abnormality. Hepatobiliary: No solid liver abnormality is seen. No gallstones, gallbladder wall thickening, or biliary  dilatation. Pancreas: Unremarkable. No pancreatic ductal dilatation or surrounding inflammatory changes. Spleen: Normal in size without significant abnormality. Adrenals/Urinary Tract: Adrenal glands are unremarkable. Kidneys are normal, without renal calculi, solid lesion, or hydronephrosis. Bladder is unremarkable. Stomach/Bowel: Stomach is within normal limits. Appendix appears normal. The mid to distal small bowel is mildly distended and fluid-filled, largest loops measuring up to 3.6 cm. There is an abrupt caliber change in the right lower quadrant, with decompression of the remaining loops of terminal ileum (series 2, image 54, series 5, image 62). There is stool and scattered gas present in the colon. Vascular/Lymphatic: No significant vascular findings are present. No enlarged abdominal or pelvic lymph nodes. Reproductive: Calcified uterine fibroids. Other: No abdominal wall hernia  or abnormality. No abdominopelvic ascites. Musculoskeletal: No acute or significant osseous findings. IMPRESSION: The mid to distal small bowel is mildly distended and fluid-filled, largest loops measuring up to 3.6 cm. There is an abrupt caliber change in the right lower quadrant, with decompression of the remaining loops of terminal ileum. Findings are consistent with at least partial small bowel obstruction secondary to adhesion or stricture. Electronically Signed   By: Lauralyn Primes M.D.   On: 02/22/2021 15:02    Procedures Procedures   Medications Ordered in ED Medications  ondansetron (ZOFRAN) injection 4 mg (has no administration in time range)    ED Course  I have reviewed the triage vital signs and the nursing notes.  Pertinent labs & imaging results that were available during my care of the patient were reviewed by me and considered in my medical decision making (see chart for details).  I consulted Dr. Dairl Ponder.    MDM Rules/Calculators/A&P                          The patient is a 70 years old with a  history of small bowel obstruction.  She presents with 5 days of nausea, vomiting, and diffuse abdominal pain.  Vital signs are within normal limits, and her labs are relatively normal.  She was found to have a partial small bowel obstruction and will be admitted for further evaluation and treatment. Final Clinical Impression(s) / ED Diagnoses Final diagnoses:  Small bowel obstruction due to adhesions [facility])    Rx / DC Orders ED Discharge Orders    None       Koleen Distance, MD 02/22/21 1550

## 2021-02-22 NOTE — Progress Notes (Signed)
Overall labs look good. Your white count is elevated and given your abdominal pain I sent antibiotics to your pharmacy. Augmentin three times  a day for 7 days. I would also recommend restarting your crestor given your elevated cholesterol. Your A1c is elevated placing you in the pre-diabetes range, no medications needed at this time but continue to watch your diet and exercise. Your TSH is slightly low, when you are feeling better I would like to recheck this in clinic to confirm.

## 2021-02-22 NOTE — ED Triage Notes (Addendum)
Patient c/o abdominal pain with vomiting x4 days. Seen at [facility] and given miralax and colace for constipation. Reports hx SBO. Last BM 3/11.

## 2021-02-22 NOTE — H&P (Signed)
History and Physical        Hospital Admission Note Date: 02/22/2021  Patient name: Joyce Price Medical record number: [id number] Date of birth: May 20, 1951 Age: 70 y.o. Gender: female  PCP: Merri Brunette, MD   Chief Complaint    Chief Complaint  Patient presents with  . Abdominal Pain  . Emesis      HPI:   This is a 70 year old female with a past medical history of hypertension, hyperlipidemia, distant history of SBO in the 90s which required unknown abdominal surgery who presented to the ED with 5 days of generalized abdominal pain, nausea, vomiting and constipation. She was seen in urgent care several days ago And was prescribed Colace and MiraLAX for constipation but she has not had any improvement.  She has been unable to keep any food down and has had some abdominal bloating.  Currently feeling a little better than when she presented but still not back to baseline.  Had a bowel movement yesterday morning but has not had one since.  No fever, chills, night sweats or any other complaints.   ED Course: Afebrile, hypertensive, on room air. Notable Labs: K3.4, glucose 118, BUN 27, creatinine 1.03, WBC 9.2 (down from 14.6 on 3/10), COVID-19 pending.  Notable Imaging: CT abdomen pelvis with contrast-partial SBO secondary to adhesion or stricture. Patient received Zofran, 1 L NS bolus.    Vitals:   02/22/21 1426 02/22/21 1622  BP: 140/90 (!) 168/73  Pulse: 67 75  Resp: 15 15  Temp:  98.6 F (37 C)  SpO2: 99% 100%     Review of Systems:  Review of Systems  All other systems reviewed and are negative.   Medical/Social/Family History   Past Medical History: Past Medical History:  Diagnosis Date  . Hypertension     History reviewed. No pertinent surgical history.  Medications: Prior to Admission medications   Medication Sig Start Date End Date Taking?  Authorizing Provider  amoxicillin-clavulanate (AUGMENTIN) 875-125 MG tablet Take 1 tablet by mouth in the morning, at noon, and at bedtime for 7 days. 02/22/21 03/01/21  Just, Azalee Course, FNP  aspirin 81 MG tablet Take 81 mg by mouth daily.    [provider]  calcium citrate-vitamin D (CITRACAL+D) 315-200 MG-UNIT per tablet Take 1 tablet by mouth 2 (two) times daily.    [provider]  docusate sodium (COLACE) 100 MG capsule Take 1-2 capsules (100-200 mg total) by mouth 2 (two) times daily. 02/20/21   Just, Azalee Course, FNP  ipratropium (ATROVENT) 0.03 % nasal spray Place 2 sprays into both nostrils 4 (four) times daily. 12/01/18   Peyton Najjar, MD  levocetirizine (XYZAL) 5 MG tablet Take 1 tablet (5 mg total) by mouth every evening. 02/26/18   McVey, Madelaine Bhat, PA-C  olmesartan-hydrochlorothiazide (BENICAR HCT) 40-25 MG tablet Take 1 tablet by mouth daily.    [provider]  polyethylene glycol powder (GLYCOLAX/MIRALAX) 17 GM/SCOOP powder Take 17 g by mouth 2 (two) times daily as needed. 02/20/21   Just, Azalee Course, FNP  rosuvastatin (CRESTOR) 20 MG tablet Take 1 tablet (20 mg total) by mouth daily. 02/22/21   Just, Azalee Course, FNP    Allergies:   Allergies  Allergen Reactions  . Rosuvastatin Other (See Comments)    Cramps    Social History:  reports that she has never smoked. She has never used smokeless tobacco. She reports current alcohol use of about 1.0 standard drink of alcohol per week. She reports that she does not use drugs.  Family History: Family History  Problem Relation Age of Onset  . Cancer Father   . Hypertension Sister   . Hypertension Brother      Objective   Physical Exam: Blood pressure (!) 168/73, pulse 75, temperature 98.6 F (37 C), temperature source Oral, resp. rate 15, SpO2 100 %.  Physical Exam Vitals and nursing note reviewed.  Constitutional:      Appearance: Normal appearance.  HENT:     Head: Normocephalic and  atraumatic.  Eyes:     Conjunctiva/sclera: Conjunctivae normal.  Cardiovascular:     Rate and Rhythm: Normal rate and regular rhythm.  Pulmonary:     Effort: Pulmonary effort is normal.     Breath sounds: Normal breath sounds.  Abdominal:     General: There is distension.     Tenderness: There is generalized abdominal tenderness.  Musculoskeletal:        General: No swelling or tenderness.  Skin:    Coloration: Skin is not jaundiced or pale.  Neurological:     Mental Status: She is alert. Mental status is at baseline.  Psychiatric:        Mood and Affect: Mood normal.        Behavior: Behavior normal.     LABS on Admission: I have personally reviewed all the labs and imaging below    Basic Metabolic Panel: Recent Labs  Lab 02/20/21 1044 02/22/21 1305  NA 136 135  K 3.8 3.4*  CL 93* 94*  CO2 25 27  GLUCOSE 110* 118*  BUN 21 27*  CREATININE 1.09* 1.03*  CALCIUM 10.4* 9.7   Liver Function Tests: Recent Labs  Lab 02/20/21 1044 02/22/21 1305  AST 29 34  ALT 20 30  ALKPHOS 82 57  BILITOT 1.0 1.6*  PROT 8.1 8.1  ALBUMIN 4.6 4.0   Recent Labs  Lab 02/20/21 1044 02/22/21 1305  LIPASE  --  20  AMYLASE 63  --    No results for input(s): AMMONIA in the last 168 hours. CBC: Recent Labs  Lab 02/20/21 1044 02/22/21 1305  WBC 14.6* 9.2  NEUTROABS 12.7*  --   HGB 16.3* 15.0  HCT 47.9* 43.7  MCV 88 88.5  PLT 443 383   Cardiac Enzymes: No results for input(s): CKTOTAL, CKMB, CKMBINDEX, TROPONINI in the last 168 hours. BNP: Invalid input(s): POCBNP CBG: No results for input(s): GLUCAP in the last 168 hours.  Radiological Exams on Admission:  CT Abdomen Pelvis W Contrast  Result Date: 02/22/2021 CLINICAL DATA:  History of bowel adhesions, obstruction 25 years ago. Nausea, abdominal discomfort EXAM: CT ABDOMEN AND PELVIS WITH CONTRAST TECHNIQUE: Multidetector CT imaging of the abdomen and pelvis was performed using the standard protocol following bolus  administration of intravenous contrast. CONTRAST:  OMNIPAQUE IOHEXOL 300 MG/ML  SOLN COMPARISON:  None. FINDINGS: Lower chest: No acute abnormality. Hepatobiliary: No solid liver abnormality is seen. No gallstones, gallbladder wall thickening, or biliary dilatation. Pancreas: Unremarkable. No pancreatic ductal dilatation or surrounding inflammatory changes. Spleen: Normal in size without significant abnormality. Adrenals/Urinary Tract: Adrenal glands are unremarkable. Kidneys are normal, without renal calculi, solid lesion, or hydronephrosis. Bladder is unremarkable. Stomach/Bowel: Stomach is within normal limits. Appendix  appears normal. The mid to distal small bowel is mildly distended and fluid-filled, largest loops measuring up to 3.6 cm. There is an abrupt caliber change in the right lower quadrant, with decompression of the remaining loops of terminal ileum (series 2, image 54, series 5, image 62). There is stool and scattered gas present in the colon. Vascular/Lymphatic: No significant vascular findings are present. No enlarged abdominal or pelvic lymph nodes. Reproductive: Calcified uterine fibroids. Other: No abdominal wall hernia or abnormality. No abdominopelvic ascites. Musculoskeletal: No acute or significant osseous findings. IMPRESSION: The mid to distal small bowel is mildly distended and fluid-filled, largest loops measuring up to 3.6 cm. There is an abrupt caliber change in the right lower quadrant, with decompression of the remaining loops of terminal ileum. Findings are consistent with at least partial small bowel obstruction secondary to adhesion or stricture. Electronically Signed   By: Lauralyn Primes M.D.   On: 02/22/2021 15:02      EKG: Not done   A & P   Principal Problem:   Partial small bowel obstruction (HCC) Active Problems:   Benign essential HTN   Hyperlipidemia   1. Partial SBO secondary to adhesions a. Noted on CT scan b. Distant history of an unknown  abdominal surgery in the 90s for prior SBO c. Bowel rest d. IV fluids e. Antiemetics  2. Hypertension a. Holding home Benicar b. Lopressor as needed  3. Hyperlipidemia a. Holding home statin    DVT prophylaxis: Lovenox   Code Status: Full Code  Diet: N.p.o. Family Communication: Admission, patients condition and plan of care including tests being ordered have been discussed with the patient who indicates understanding and agrees with the plan and Code Status.  Disposition Plan: The appropriate patient status for this patient is OBSERVATION. Observation status is judged to be reasonable and necessary in order to provide the required intensity of service to ensure the patient's safety. The patient's presenting symptoms, physical exam findings, and initial radiographic and laboratory data in the context of their medical condition is felt to place them at decreased risk for further clinical deterioration. Furthermore, it is anticipated that the patient will be medically stable for discharge from the hospital within 2 midnights of admission. The following factors support the patient status of observation.   " The patient's presenting symptoms include abdominal pain, nausea vomiting, constipation. " The physical exam findings include abdominal discomfort. " The initial radiographic and laboratory data are partial SBO.    Status is: Observation  The patient remains OBS appropriate and will d/c before 2 midnights.  Dispo: The patient is from: Home              Anticipated d/c is to: Home              Patient currently is not medically stable to d/c.   Difficult to place patient No       Consultants  . None  Procedures  . None  Time Spent on Admission: 60 minutes    [name], DO [facility]  02/22/2021, 5:26 PM

## 2021-02-23 ENCOUNTER — Observation Stay (HOSPITAL_COMMUNITY): Payer: Medicare Other

## 2021-02-23 DIAGNOSIS — K56600 Partial intestinal obstruction, unspecified as to cause: Secondary | ICD-10-CM | POA: Diagnosis not present

## 2021-02-23 DIAGNOSIS — K5669 Other partial intestinal obstruction: Secondary | ICD-10-CM | POA: Diagnosis not present

## 2021-02-23 DIAGNOSIS — K56609 Unspecified intestinal obstruction, unspecified as to partial versus complete obstruction: Secondary | ICD-10-CM | POA: Diagnosis not present

## 2021-02-23 DIAGNOSIS — Z4682 Encounter for fitting and adjustment of non-vascular catheter: Secondary | ICD-10-CM | POA: Diagnosis not present

## 2021-02-23 DIAGNOSIS — K565 Intestinal adhesions [bands], unspecified as to partial versus complete obstruction: Secondary | ICD-10-CM | POA: Diagnosis not present

## 2021-02-23 DIAGNOSIS — E7800 Pure hypercholesterolemia, unspecified: Secondary | ICD-10-CM | POA: Diagnosis not present

## 2021-02-23 DIAGNOSIS — K6389 Other specified diseases of intestine: Secondary | ICD-10-CM | POA: Diagnosis not present

## 2021-02-23 DIAGNOSIS — I10 Essential (primary) hypertension: Secondary | ICD-10-CM | POA: Diagnosis not present

## 2021-02-23 LAB — CBC
HCT: 41.8 % (ref 36.0–46.0)
Hemoglobin: 14.4 g/dL (ref 12.0–15.0)
MCH: 30.4 pg (ref 26.0–34.0)
MCHC: 34.4 g/dL (ref 30.0–36.0)
MCV: 88.2 fL (ref 80.0–100.0)
Platelets: 295 10*3/uL (ref 150–400)
RBC: 4.74 MIL/uL (ref 3.87–5.11)
RDW: 12.6 % (ref 11.5–15.5)
WBC: 6.7 10*3/uL (ref 4.0–10.5)
nRBC: 0 % (ref 0.0–0.2)

## 2021-02-23 LAB — BASIC METABOLIC PANEL
Anion gap: 14 (ref 5–15)
BUN: 25 mg/dL — ABNORMAL HIGH (ref 8–23)
CO2: 26 mmol/L (ref 22–32)
Calcium: 8.9 mg/dL (ref 8.9–10.3)
Chloride: 98 mmol/L (ref 98–111)
Creatinine, Ser: 0.99 mg/dL (ref 0.44–1.00)
GFR, Estimated: >60 mL/min (ref >60)
Glucose, Bld: 98 mg/dL (ref 70–99)
Potassium: 3.2 mmol/L — ABNORMAL LOW (ref 3.5–5.1)
Sodium: 138 mmol/L (ref 135–145)

## 2021-02-23 LAB — RESPIRATORY PANEL BY RT PCR (FLU A&B, COVID)
Influenza A by PCR: NEGATIVE
Influenza B by PCR: NEGATIVE
SARS Coronavirus 2 by RT PCR: NEGATIVE

## 2021-02-23 LAB — HIV ANTIBODY (ROUTINE TESTING W REFLEX): HIV Screen 4th Generation wRfx: NONREACTIVE

## 2021-02-23 MED ORDER — LACTATED RINGERS IV SOLN: INTRAVENOUS | Status: DC

## 2021-02-23 MED ORDER — HYDRALAZINE HCL 20 MG/ML IJ SOLN
10.0000 mg | Freq: Four times a day (QID) | INTRAMUSCULAR | Status: DC | PRN
  Administered 2021-02-24: 10 mg via INTRAVENOUS
  Filled 2021-02-23: qty 1

## 2021-02-23 MED ORDER — MENTHOL 3 MG MT LOZG
1.0000 | LOZENGE | OROMUCOSAL | Status: DC | PRN
  Filled 2021-02-23: qty 9

## 2021-02-23 NOTE — Progress Notes (Signed)
MD notified of pt vomiting this evening, per order, # 16 Fr NG tube inserted into R nare w immediate return of bile material (approx 150 cc). Pt tol well, awaiting xray to verify tube placement.

## 2021-02-23 NOTE — Consult Note (Signed)
Reason for Consult:SBO Referring Physician: Rai MD   Joyce Price is an 70 y.o. female.  HPI: Asked to see pt due to 5 day hx of N/V and abdominal pain.  Seen wed by urgent care and diagnosed with constipation.  Did not get better and came to ED.  Pain better today and no vomiting.  CT showed SBO.  WBC normal.  No pain currently and no NGT. No BM today. Hx of EX LAP FOR SBO years ago.   Past Medical History:  Diagnosis Date  . Hypertension     History reviewed. No pertinent surgical history.  Family History  Problem Relation Age of Onset  . Cancer Father   . Hypertension Sister   . Hypertension Brother     Social History:  reports that she has never smoked. She has never used smokeless tobacco. She reports current alcohol use of about 1.0 standard drink of alcohol per week. She reports that she does not use drugs.  Allergies:  Allergies  Allergen Reactions  . Rosuvastatin Other (See Comments)    Cramps    Medications: I have reviewed the patient's current medications.  Results for orders placed or performed during the hospital encounter of 02/22/21 (from the past 48 hour(s))  Lipase, blood     Status: None   Collection Time: 02/22/21  1:05 PM  Result Value Ref Range   Lipase 20 11 - 51 U/L    Comment: Performed at [facility], [address], [place], [place] [address]  Comprehensive metabolic panel     Status: Abnormal   Collection Time: 02/22/21  1:05 PM  Result Value Ref Range   Sodium 135 135 - 145 mmol/L   Potassium 3.4 (L) 3.5 - 5.1 mmol/L   Chloride 94 (L) 98 - 111 mmol/L   CO2 27 22 - 32 mmol/L   Glucose, Bld 118 (H) 70 - 99 mg/dL    Comment: Glucose reference range applies only to samples taken after fasting for at least 8 hours.   BUN 27 (H) 8 - 23 mg/dL   Creatinine, Ser 9.62 (H) 0.44 - 1.00 mg/dL   Calcium 9.7 8.9 - 83.6 mg/dL   Total Protein 8.1 6.5 - 8.1 g/dL   Albumin 4.0 3.5 - 5.0 g/dL   AST 34 15 - 41 U/L   ALT 30 0 - 44 U/L    Alkaline Phosphatase 57 38 - 126 U/L   Total Bilirubin 1.6 (H) 0.3 - 1.2 mg/dL   GFR, Estimated 59 (L) >60 mL/min    Comment: (NOTE) Calculated using the CKD-EPI Creatinine Equation (2021)    Anion gap 14 5 - 15    Comment: Performed at [facility], [address], [place], [place] [address]  CBC     Status: None   Collection Time: 02/22/21  1:05 PM  Result Value Ref Range   WBC 9.2 4.0 - 10.5 K/uL   RBC 4.94 3.87 - 5.11 MIL/uL   Hemoglobin 15.0 12.0 - 15.0 g/dL   HCT 65.4 65.0 - 35.4 %   MCV 88.5 80.0 - 100.0 fL   MCH 30.4 26.0 - 34.0 pg   MCHC 34.3 30.0 - 36.0 g/dL   RDW 65.6 81.2 - 75.1 %   Platelets 383 150 - 400 K/uL   nRBC 0.0 0.0 - 0.2 %    Comment: Performed at [facility], [address] [place], [place] [address]  Respiratory Panel by RT PCR (Flu A&B,  Covid) -     Status: None   Collection Time: 02/22/21  5:21 PM  Result Value Ref Range   SARS Coronavirus 2 by RT PCR NEGATIVE NEGATIVE   Influenza A by PCR NEGATIVE NEGATIVE   Influenza B by PCR NEGATIVE NEGATIVE    Comment: Performed at [facility], [address], [place], [place] [address]  Basic metabolic panel     Status: Abnormal   Collection Time: 02/23/21  5:57 AM  Result Value Ref Range   Sodium 138 135 - 145 mmol/L   Potassium 3.2 (L) 3.5 - 5.1 mmol/L   Chloride 98 98 - 111 mmol/L   CO2 26 22 - 32 mmol/L   Glucose, Bld 98 70 - 99 mg/dL    Comment: Glucose reference range applies only to samples taken after fasting for at least 8 hours.   BUN 25 (H) 8 - 23 mg/dL   Creatinine, Ser 4.49 0.44 - 1.00 mg/dL   Calcium 8.9 8.9 - 20.1 mg/dL   GFR, Estimated >00 >71 mL/min    Comment: (NOTE) Calculated using the CKD-EPI Creatinine Equation (2021)    Anion gap 14 5 - 15    Comment: Performed at [facility], [address], [place], [place] [address]  CBC     Status: None   Collection Time: 02/23/21  5:57 AM  Result Value Ref  Range   WBC 6.7 4.0 - 10.5 K/uL   RBC 4.74 3.87 - 5.11 MIL/uL   Hemoglobin 14.4 12.0 - 15.0 g/dL   HCT 88.3 25.4 - 98.2 %   MCV 88.2 80.0 - 100.0 fL   MCH 30.4 26.0 - 34.0 pg   MCHC 34.4 30.0 - 36.0 g/dL   RDW 64.1 58.3 - 09.4 %   Platelets 295 150 - 400 K/uL   nRBC 0.0 0.0 - 0.2 %    Comment: Performed at [facility] [address], [place], [place] [address]    CT Abdomen Pelvis W Contrast  Result Date: 02/22/2021 CLINICAL DATA:  History of bowel adhesions, obstruction 25 years ago. Nausea, abdominal discomfort EXAM: CT ABDOMEN AND PELVIS WITH CONTRAST TECHNIQUE: Multidetector CT imaging of the abdomen and pelvis was performed using the standard protocol following bolus administration of intravenous contrast. CONTRAST:  OMNIPAQUE IOHEXOL 300 MG/ML  SOLN COMPARISON:  None. FINDINGS: Lower chest: No acute abnormality. Hepatobiliary: No solid liver abnormality is seen. No gallstones, gallbladder wall thickening, or biliary dilatation. Pancreas: Unremarkable. No pancreatic ductal dilatation or surrounding inflammatory changes. Spleen: Normal in size without significant abnormality. Adrenals/Urinary Tract: Adrenal glands are unremarkable. Kidneys are normal, without renal calculi, solid lesion, or hydronephrosis. Bladder is unremarkable. Stomach/Bowel: Stomach is within normal limits. Appendix appears normal. The mid to distal small bowel is mildly distended and fluid-filled, largest loops measuring up to 3.6 cm. There is an abrupt caliber change in the right lower quadrant, with decompression of the remaining loops of terminal ileum (series 2, image 54, series 5, image 62). There is stool and scattered gas present in the colon. Vascular/Lymphatic: No significant vascular findings are present. No enlarged abdominal or pelvic lymph nodes. Reproductive: Calcified uterine fibroids. Other: No abdominal wall hernia or abnormality. No abdominopelvic ascites. Musculoskeletal: No  acute or significant osseous findings. IMPRESSION: The mid to distal small bowel is mildly distended and fluid-filled, largest loops measuring up to 3.6 cm. There is an abrupt caliber change in the right lower quadrant, with decompression of the remaining loops of terminal  ileum. Findings are consistent with at least partial small bowel obstruction secondary to adhesion or stricture. Electronically Signed   By: Lauralyn Primes M.D.   On: 02/22/2021 15:02    Review of Systems  Constitutional: Negative.  Negative for activity change and appetite change.  HENT: Negative for congestion.   Eyes: Negative for itching.  Respiratory: Negative for apnea and chest tightness.   Gastrointestinal: Negative for abdominal pain, nausea and vomiting.  Genitourinary: Negative for difficulty urinating and dyspareunia.  Musculoskeletal: Negative for arthralgias and back pain.  Neurological: Negative for dizziness and facial asymmetry.  All other systems reviewed and are negative.  Blood pressure (!) 145/70, pulse 67, temperature 98.9 F (37.2 C), temperature source Oral, resp. rate 17, SpO2 99 %. Physical Exam Constitutional:      Appearance: She is well-developed.  HENT:     Head: Normocephalic.  Cardiovascular:     Rate and Rhythm: Normal rate and regular rhythm.  Pulmonary:     Effort: Pulmonary effort is normal.     Breath sounds: Normal breath sounds. No stridor.  Abdominal:     General: Abdomen is protuberant. A surgical scar is present. Bowel sounds are increased.     Palpations: Abdomen is soft.     Tenderness: There is no abdominal tenderness.     Hernia: No hernia is present.  Genitourinary:    Rectum: Normal.  Skin:    General: Skin is warm and dry.  Neurological:     Mental Status: She is alert.     Assessment/Plan: pSBO- clinically seems improved  NPO for now  KUB  Hold off on NGT unless she vomits  Will follow   Dortha Schwalbe MD  02/23/2021, 12:00 PM

## 2021-02-23 NOTE — Progress Notes (Signed)
[facility]                                                                              Patient Demographics  Joyce Price, is a 70 y.o. female, DOB - [birth year], WFU:[id number]  Admit date - 02/22/2021   Admitting Physician [name], MD  Outpatient Primary MD for the patient is Merri Brunette, MD  Outpatient specialists:   LOS - 0  days   Medical records reviewed and are as summarized below:    Chief Complaint  Patient presents with  . Abdominal Pain  . Emesis       Brief summary   Patient is a 70 year old female with past medical history of hypertension, hyperlipidemia, remote history of SBO, required unknown abdominal surgery presented to ED with 5 days of generalized abdominal pain, nausea vomiting and constipation.  She was seen in urgent care several days ago and was prescribed Colace and MiraLAX with no improvement.  She was unable to keep any food down and had abdominal bloating.  Per patient she had BM Saturday morning, since then has not passed flatus or BM.   Assessment & Plan    Principal Problem:   Partial small bowel obstruction (HCC) with prior history of SBO -CT showed partial SBO secondary to adhesion or stricture -Currently no active nausea vomiting, however no flatus or BM, hypoactive bowel sounds -Keep n.p.o. status, placed on IV fluid hydration, discussed with surgery, Dr. Luisa Hart, will evaluate for further recommendations  Active Problems:   Benign essential HTN -BP currently elevated, placed on IV hydralazine as needed with parameters -Hold p.o. meds    Hyperlipidemia -Not on any statins outpatient  Code Status: Full CODE STATUS  DVT Prophylaxis:  enoxaparin (LOVENOX) injection 40 mg Start: 02/22/21 1715   Level of Care: Level of care: Med-Surg Family Communication: Discussed all imaging results, lab results, explained to the patient    Disposition Plan:     Status is: Observation  The patient will require care  spanning > 2 midnights and should be moved to inpatient because: IV treatments appropriate due to intensity of illness or inability to take PO  Dispo: The patient is from: Home              Anticipated d/c is to: Home              Patient currently is not medically stable to d/c.  General surgery consulted, will follow recommendations for SBO   Difficult to place patient No      Time Spent in minutes 35 minutes  Procedures:  None  Consultants:   General surgery  Antimicrobials:   Anti-infectives (From admission, onward)   None          Medications  Scheduled Meds: . enoxaparin (LOVENOX) injection  40 mg Subcutaneous Daily   Continuous Infusions: PRN Meds:.metoprolol tartrate, morphine injection, ondansetron **OR** ondansetron (ZOFRAN) IV, polyethylene glycol      Subjective:   Joyce Price was seen and examined today.  Patient states no BM or flatus, no vomiting currently.  Abdominal pain improving.  Patient denies dizziness, chest pain, shortness of breath, new  weakness, numbess, tingling. No acute events overnight.    Objective:   Vitals:   02/22/21 2159 02/23/21 0051 02/23/21 0526 02/23/21 0947  BP: (!) 152/73 (!) 150/87 (!) 170/77 (!) 145/70  Pulse: 70 73 71 67  Resp: 15 18 18 17   Temp: 99.3 F (37.4 C) 98.9 F (37.2 C) 98.6 F (37 C) 98.9 F (37.2 C)  TempSrc: Oral Oral Oral Oral  SpO2: 99% 99% 97% 99%    Intake/Output Summary (Last 24 hours) at 02/23/2021 1136 Last data filed at 02/23/2021 1000 Gross per 24 hour  Intake 2200 ml  Output 550 ml  Net 1650 ml     Wt Readings from Last 3 Encounters:  02/20/21 71.7 kg  12/01/18 69.1 kg  07/12/18 66.9 kg     Exam  General: Alert and oriented x 3, NAD  Cardiovascular: S1 S2 auscultated, no murmurs, RRR  Respiratory: Clear to auscultation bilaterally, no wheezing, rales or rhonchi  Gastrointestinal: Soft, minimal tenderness, hypoactive bowel sounds   Ext: no pedal edema  bilaterally  Neuro: no new deficits  Musculoskeletal: No digital cyanosis, clubbing  Skin: No rashes  Psych: Normal affect and demeanor, alert and oriented x3    Data Reviewed:  I have personally reviewed following labs and imaging studies  Micro Results Recent Results (from the past 240 hour(s))  Respiratory Panel by RT PCR (Flu A&B, Covid) -     Status: None   Collection Time: 02/22/21  5:21 PM  Result Value Ref Range Status   SARS Coronavirus 2 by RT PCR NEGATIVE NEGATIVE Final   Influenza A by PCR NEGATIVE NEGATIVE Final   Influenza B by PCR NEGATIVE NEGATIVE Final    Comment: Performed at [facility], [address], [place], [place]    Radiology Reports CT Abdomen Pelvis W Contrast  Result Date: 02/22/2021 CLINICAL DATA:  History of bowel adhesions, obstruction 25 years ago. Nausea, abdominal discomfort EXAM: CT ABDOMEN AND PELVIS WITH CONTRAST TECHNIQUE: Multidetector CT imaging of the abdomen and pelvis was performed using the standard protocol following bolus administration of intravenous contrast. CONTRAST:  04/24/2021 OMNIPAQUE IOHEXOL 300 MG/ML  SOLN COMPARISON:  None. FINDINGS: Lower chest: No acute abnormality. Hepatobiliary: No solid liver abnormality is seen. No gallstones, gallbladder wall thickening, or biliary dilatation. Pancreas: Unremarkable. No pancreatic ductal dilatation or surrounding inflammatory changes. Spleen: Normal in size without significant abnormality. Adrenals/Urinary Tract: Adrenal glands are unremarkable. Kidneys are normal, without renal calculi, solid lesion, or hydronephrosis. Bladder is unremarkable. Stomach/Bowel: Stomach is within normal limits. Appendix appears normal. The mid to distal small bowel is mildly distended and fluid-filled, largest loops measuring up to 3.6 cm. There is an abrupt caliber change in the right lower quadrant, with decompression of the remaining loops of terminal ileum (series 2, image 54, series  5, image 62). There is stool and scattered gas present in the colon. Vascular/Lymphatic: No significant vascular findings are present. No enlarged abdominal or pelvic lymph nodes. Reproductive: Calcified uterine fibroids. Other: No abdominal wall hernia or abnormality. No abdominopelvic ascites. Musculoskeletal: No acute or significant osseous findings. IMPRESSION: The mid to distal small bowel is mildly distended and fluid-filled, largest loops measuring up to 3.6 cm. There is an abrupt caliber change in the right lower quadrant, with decompression of the remaining loops of terminal ileum. Findings are consistent with at least partial small bowel obstruction secondary to adhesion or stricture. Electronically Signed   By: M.D.   On: 02/22/2021 15:02  Lab Data:  CBC: Recent Labs  Lab 02/20/21 1044 02/22/21 1305 02/23/21 0557  WBC 14.6* 9.2 6.7  NEUTROABS 12.7*  --   --   HGB 16.3* 15.0 14.4  HCT 47.9* 43.7 41.8  MCV 88 88.5 88.2  PLT 443 383 295   Basic Metabolic Panel: Recent Labs  Lab 02/20/21 1044 02/22/21 1305 02/23/21 0557  NA 136 135 138  K 3.8 3.4* 3.2*  CL 93* 94* 98  CO2 25 27 26   GLUCOSE 110* 118* 98  BUN 21 27* 25*  CREATININE 1.09* 1.03* 0.99  CALCIUM 10.4* 9.7 8.9   GFR: Estimated Creatinine Clearance: 52.9 mL/min (by C-G formula based on SCr of 0.99 mg/dL). Liver Function Tests: Recent Labs  Lab 02/20/21 1044 02/22/21 1305  AST 29 34  ALT 20 30  ALKPHOS 82 57  BILITOT 1.0 1.6*  PROT 8.1 8.1  ALBUMIN 4.6 4.0   Recent Labs  Lab 02/20/21 1044 02/22/21 1305  LIPASE  --  20  AMYLASE 63  --    No results for input(s): AMMONIA in the last 168 hours. Coagulation Profile: No results for input(s): INR, PROTIME in the last 168 hours. Cardiac Enzymes: No results for input(s): CKTOTAL, CKMB, CKMBINDEX, TROPONINI in the last 168 hours. BNP (last 3 results) No results for input(s): PROBNP in the last 8760 hours. HbA1C: Recent Labs     02/20/21 1044  HGBA1C 5.7*   CBG: No results for input(s): GLUCAP in the last 168 hours. Lipid Profile: Recent Labs    02/20/21 1044  CHOL 330*  HDL 95  LDLCALC 223*  TRIG 79  CHOLHDL 3.5   Thyroid Function Tests: Recent Labs    02/20/21 1044  TSH 0.409*   Anemia Panel: No results for input(s): VITAMINB12, FOLATE, FERRITIN, TIBC, IRON, RETICCTPCT in the last 72 hours. Urine analysis:    Component Value Date/Time   BILIRUBINUR small (A) 02/20/2021 1012   KETONESUR small (15) (A) 02/20/2021 1012   PROTEINUR =100 (A) 02/20/2021 1012   UROBILINOGEN 0.2 02/20/2021 1012   NITRITE Negative 02/20/2021 1012   LEUKOCYTESUR Negative 02/20/2021 1012     Chaske Paskett M.D. [facility] 02/23/2021, 11:36 AM  Available via Epic secure chat 7am-7pm After 7 pm, please refer to night coverage provider listed on amion.

## 2021-02-24 ENCOUNTER — Inpatient Hospital Stay (HOSPITAL_COMMUNITY): Payer: Medicare Other

## 2021-02-24 ENCOUNTER — Encounter (HOSPITAL_COMMUNITY): Payer: Self-pay | Admitting: Internal Medicine

## 2021-02-24 DIAGNOSIS — K56609 Unspecified intestinal obstruction, unspecified as to partial versus complete obstruction: Secondary | ICD-10-CM | POA: Diagnosis present

## 2021-02-24 DIAGNOSIS — R1084 Generalized abdominal pain: Secondary | ICD-10-CM | POA: Diagnosis present

## 2021-02-24 DIAGNOSIS — Z888 Allergy status to other drugs, medicaments and biological substances status: Secondary | ICD-10-CM | POA: Diagnosis not present

## 2021-02-24 DIAGNOSIS — Z20822 Contact with and (suspected) exposure to covid-19: Secondary | ICD-10-CM | POA: Diagnosis present

## 2021-02-24 DIAGNOSIS — E785 Hyperlipidemia, unspecified: Secondary | ICD-10-CM | POA: Diagnosis present

## 2021-02-24 DIAGNOSIS — Z79899 Other long term (current) drug therapy: Secondary | ICD-10-CM | POA: Diagnosis not present

## 2021-02-24 DIAGNOSIS — K56600 Partial intestinal obstruction, unspecified as to cause: Secondary | ICD-10-CM | POA: Diagnosis not present

## 2021-02-24 DIAGNOSIS — Z7982 Long term (current) use of aspirin: Secondary | ICD-10-CM | POA: Diagnosis not present

## 2021-02-24 DIAGNOSIS — I10 Essential (primary) hypertension: Secondary | ICD-10-CM | POA: Diagnosis present

## 2021-02-24 DIAGNOSIS — Z8249 Family history of ischemic heart disease and other diseases of the circulatory system: Secondary | ICD-10-CM | POA: Diagnosis not present

## 2021-02-24 DIAGNOSIS — Z0189 Encounter for other specified special examinations: Secondary | ICD-10-CM | POA: Diagnosis not present

## 2021-02-24 DIAGNOSIS — K5651 Intestinal adhesions [bands], with partial obstruction: Secondary | ICD-10-CM | POA: Diagnosis present

## 2021-02-24 LAB — BASIC METABOLIC PANEL
Anion gap: 12 (ref 5–15)
BUN: 25 mg/dL — ABNORMAL HIGH (ref 8–23)
CO2: 27 mmol/L (ref 22–32)
Calcium: 8.9 mg/dL (ref 8.9–10.3)
Chloride: 101 mmol/L (ref 98–111)
Creatinine, Ser: 0.82 mg/dL (ref 0.44–1.00)
GFR, Estimated: >60 mL/min (ref >60)
Glucose, Bld: 87 mg/dL (ref 70–99)
Potassium: 3.3 mmol/L — ABNORMAL LOW (ref 3.5–5.1)
Sodium: 140 mmol/L (ref 135–145)

## 2021-02-24 LAB — CBC
HCT: 36.5 % (ref 36.0–46.0)
Hemoglobin: 12.5 g/dL (ref 12.0–15.0)
MCH: 30.3 pg (ref 26.0–34.0)
MCHC: 34.2 g/dL (ref 30.0–36.0)
MCV: 88.4 fL (ref 80.0–100.0)
Platelets: 294 10*3/uL (ref 150–400)
RBC: 4.13 MIL/uL (ref 3.87–5.11)
RDW: 12.6 % (ref 11.5–15.5)
WBC: 7.3 10*3/uL (ref 4.0–10.5)
nRBC: 0 % (ref 0.0–0.2)

## 2021-02-24 MED ORDER — LACTATED RINGERS IV BOLUS: 1000.0000 mL | Freq: Three times a day (TID) | INTRAVENOUS | Status: AC | PRN

## 2021-02-24 MED ORDER — SIMETHICONE 40 MG/0.6ML PO SUSP
40.0000 mg | Freq: Four times a day (QID) | ORAL | Status: DC | PRN
  Filled 2021-02-24: qty 0.6

## 2021-02-24 MED ORDER — POTASSIUM CHLORIDE 10 MEQ/100ML IV SOLN
10.0000 meq | INTRAVENOUS | Status: AC
  Administered 2021-02-24 (×3): 10 meq via INTRAVENOUS
  Filled 2021-02-24: qty 100

## 2021-02-24 MED ORDER — ACETAMINOPHEN 650 MG RE SUPP: 650.0000 mg | Freq: Four times a day (QID) | RECTAL | Status: DC | PRN

## 2021-02-24 MED ORDER — MAGIC MOUTHWASH
15.0000 mL | Freq: Four times a day (QID) | ORAL | Status: DC | PRN
  Filled 2021-02-24: qty 15

## 2021-02-24 MED ORDER — ALUM & MAG HYDROXIDE-SIMETH 200-200-20 MG/5ML PO SUSP: 30.0000 mL | Freq: Four times a day (QID) | ORAL | Status: DC | PRN

## 2021-02-24 MED ORDER — ONDANSETRON HCL 4 MG/2ML IJ SOLN
4.0000 mg | Freq: Four times a day (QID) | INTRAMUSCULAR | Status: DC | PRN
  Administered 2021-02-24 – 2021-02-26 (×3): 4 mg via INTRAVENOUS
  Filled 2021-02-24 (×3): qty 2

## 2021-02-24 MED ORDER — DIATRIZOATE MEGLUMINE & SODIUM 66-10 % PO SOLN
90.0000 mL | Freq: Once | ORAL | Status: AC
  Administered 2021-02-24: 90 mL via NASOGASTRIC
  Filled 2021-02-24: qty 90

## 2021-02-24 MED ORDER — BISACODYL 10 MG RE SUPP
10.0000 mg | Freq: Every day | RECTAL | Status: DC
  Administered 2021-02-24 – 2021-02-25 (×2): 10 mg via RECTAL
  Filled 2021-02-24 (×4): qty 1

## 2021-02-24 MED ORDER — SODIUM CHLORIDE 0.9 % IV SOLN
8.0000 mg | Freq: Four times a day (QID) | INTRAVENOUS | Status: DC | PRN
  Filled 2021-02-24: qty 4

## 2021-02-24 MED ORDER — METHOCARBAMOL 1000 MG/10ML IJ SOLN
1000.0000 mg | Freq: Four times a day (QID) | INTRAVENOUS | Status: DC | PRN
  Filled 2021-02-24: qty 10

## 2021-02-24 MED ORDER — PHENOL 1.4 % MT LIQD
2.0000 | OROMUCOSAL | Status: DC | PRN
  Filled 2021-02-24: qty 177

## 2021-02-24 MED ORDER — DIPHENHYDRAMINE HCL 50 MG/ML IJ SOLN: 12.5000 mg | Freq: Four times a day (QID) | INTRAMUSCULAR | Status: DC | PRN

## 2021-02-24 MED ORDER — LIP MEDEX EX OINT
1.0000 "application " | TOPICAL_OINTMENT | Freq: Two times a day (BID) | CUTANEOUS | Status: DC
  Administered 2021-02-24 – 2021-02-27 (×6): 1 via TOPICAL
  Filled 2021-02-24: qty 7

## 2021-02-24 NOTE — Plan of Care (Signed)
  Problem: Pain Managment: Goal: General experience of comfort will improve Outcome: Progressing   Problem: Safety: Goal: Ability to remain free from injury will improve Outcome: Progressing   Problem: Elimination: Goal: Will not experience complications related to bowel motility Outcome: Progressing Goal: Will not experience complications related to urinary retention Outcome: Progressing   

## 2021-02-24 NOTE — Progress Notes (Signed)
Subjective: No flatus or BM this am, but abdomen feels less painful and no longer distended.  NGT in place with mostly watered down output.    ROS: See above, otherwise other systems negative  Objective: Vital signs in last 24 hours: Temp:  [98.7 F (37.1 C)-99.1 F (37.3 C)] 98.8 F (37.1 C) (03/14 0605) Pulse Rate:  [65-78] 65 (03/14 0605) Resp:  [16-18] 18 (03/14 0605) BP: (154-157)/(75-78) 154/75 (03/14 0605) SpO2:  [97 %-100 %] 99 % (03/14 0605) Weight:  [71.7 kg] 71.7 kg (03/14 0605) Last BM Date: 02/21/21  Intake/Output from previous day: 03/13 0701 - 03/14 0700 In: 80 [P.O.:30; NG/GT:50] Out: 500 [Emesis/NG output:500] Intake/Output this shift: Total I/O In: 0  Out: 100 [Emesis/NG output:100]  PE: Heart: regular Lungs: CTAB Abd: soft, not really tender, ND, +BS, NGT with mostly watered down output  Lab Results:  Recent Labs    02/23/21 0557 02/24/21 0435  WBC 6.7 7.3  HGB 14.4 12.5  HCT 41.8 36.5  PLT 295 294   BMET Recent Labs    02/23/21 0557 02/24/21 0435  NA 138 140  K 3.2* 3.3*  CL 98 101  CO2 26 27  GLUCOSE 98 87  BUN 25* 25*  CREATININE 0.99 0.82  CALCIUM 8.9 8.9   PT/INR No results for input(s): LABPROT, INR in the last 72 hours. CMP     Component Value Date/Time   NA 140 02/24/2021 0435   NA 136 02/20/2021 1044   K 3.3 (L) 02/24/2021 0435   CL 101 02/24/2021 0435   CO2 27 02/24/2021 0435   GLUCOSE 87 02/24/2021 0435   BUN 25 (H) 02/24/2021 0435   BUN 21 02/20/2021 1044   CREATININE 0.82 02/24/2021 0435   CALCIUM 8.9 02/24/2021 0435   PROT 8.1 02/22/2021 1305   PROT 8.1 02/20/2021 1044   ALBUMIN 4.0 02/22/2021 1305   ALBUMIN 4.6 02/20/2021 1044   AST 34 02/22/2021 1305   ALT 30 02/22/2021 1305   ALKPHOS 57 02/22/2021 1305   BILITOT 1.6 (H) 02/22/2021 1305   BILITOT 1.0 02/20/2021 1044   GFRNONAA >60 02/24/2021 0435   Lipase     Component Value Date/Time   LIPASE 20 02/22/2021 1305        Studies/Results: DG Abd 1 View  Result Date: 02/23/2021 CLINICAL DATA:  Enteric catheter placement EXAM: ABDOMEN - 1 VIEW COMPARISON:  02/23/2021, 02/22/2021 FINDINGS: Supine frontal views of the abdomen and pelvis are obtained. Enteric catheter passes below diaphragm, tip overlying the gastric fundus. The enteric catheter appears kinked at the level of the side port. Please correlate with catheter function. Continued gaseous distention of the small bowel, measuring up to 4.5 cm. Findings are consistent with small-bowel obstruction. Stable uterine calcifications within the pelvis. IMPRESSION: 1. Enteric catheter tip overlying gastric fundus. The catheter appears kinked at the level of the side port. Please correlate with catheter function. 2. Persistent small bowel obstruction. Electronically Signed   By: Sharlet Salina M.D.   On: 02/23/2021 22:12   DG Abd 1 View  Result Date: 02/23/2021 CLINICAL DATA:  NG tube placement EXAM: ABDOMEN - 1 VIEW COMPARISON:  CT 02/22/2021 FINDINGS: Lung bases are clear. Esophageal tube is coiled within the stomach, the tip projects over the mid gastric region. Dilated loops of bowel in the upper abdomen concerning for an obstruction. IMPRESSION: Esophageal tube coiled within the stomach, tip projects over the gastric body. Dilated loops of small bowel consistent with obstruction. Electronically  Signed   By: Jasmine Pang M.D.   On: 02/23/2021 20:15   CT Abdomen Pelvis W Contrast  Result Date: 02/22/2021 CLINICAL DATA:  History of bowel adhesions, obstruction 25 years ago. Nausea, abdominal discomfort EXAM: CT ABDOMEN AND PELVIS WITH CONTRAST TECHNIQUE: Multidetector CT imaging of the abdomen and pelvis was performed using the standard protocol following bolus administration of intravenous contrast. CONTRAST:  OMNIPAQUE IOHEXOL 300 MG/ML  SOLN COMPARISON:  None. FINDINGS: Lower chest: No acute abnormality. Hepatobiliary: No solid liver abnormality is seen.  No gallstones, gallbladder wall thickening, or biliary dilatation. Pancreas: Unremarkable. No pancreatic ductal dilatation or surrounding inflammatory changes. Spleen: Normal in size without significant abnormality. Adrenals/Urinary Tract: Adrenal glands are unremarkable. Kidneys are normal, without renal calculi, solid lesion, or hydronephrosis. Bladder is unremarkable. Stomach/Bowel: Stomach is within normal limits. Appendix appears normal. The mid to distal small bowel is mildly distended and fluid-filled, largest loops measuring up to 3.6 cm. There is an abrupt caliber change in the right lower quadrant, with decompression of the remaining loops of terminal ileum (series 2, image 54, series 5, image 62). There is stool and scattered gas present in the colon. Vascular/Lymphatic: No significant vascular findings are present. No enlarged abdominal or pelvic lymph nodes. Reproductive: Calcified uterine fibroids. Other: No abdominal wall hernia or abnormality. No abdominopelvic ascites. Musculoskeletal: No acute or significant osseous findings. IMPRESSION: The mid to distal small bowel is mildly distended and fluid-filled, largest loops measuring up to 3.6 cm. There is an abrupt caliber change in the right lower quadrant, with decompression of the remaining loops of terminal ileum. Findings are consistent with at least partial small bowel obstruction secondary to adhesion or stricture. Electronically Signed   By: Lauralyn Primes M.D.   On: 02/22/2021 15:02    Anti-infectives: Anti-infectives (From admission, onward)   None       Assessment/Plan HTN HLD  SBO -will start SBO protocol -cont NGT and IVFs today -mobilize/pulm toilet -hopefully we can get patient better with conservative management.  FEN - NPO/IVFs VTE - Lovenox ID - none   LOS: 0 days    Letha Cape , [facility] Surgery 02/24/2021, 9:58 AM Please see Amion for pager number during day hours 7:00am-4:30pm or 7:00am  -11:30am on weekends

## 2021-02-24 NOTE — Progress Notes (Addendum)
[facility]                                                                              Patient Demographics  Joyce Price, is a 70 y.o. female, DOB - Apr 13, 1951, WGN:[id number]  Admit date - 02/22/2021   Admitting Physician Jae DireJared E Segal, MD  Outpatient Primary MD for the patient is Merri BrunettePharr, Walter, MD  Outpatient specialists:   LOS - 0  days   Medical records reviewed and are as summarized below:    Chief Complaint  Patient presents with  . Abdominal Pain  . Emesis       Brief summary   Patient is a 70 year old female with past medical history of hypertension, hyperlipidemia, remote history of SBO, required unknown abdominal surgery presented to ED with 5 days of generalized abdominal pain, nausea vomiting and constipation.  She was seen in urgent care several days ago and was prescribed Colace and MiraLAX with no improvement.  She was unable to keep any food down and had abdominal bloating.  Per patient she had BM Saturday morning, since then has not passed flatus or BM.   Assessment & Plan    Principal Problem:   Partial small bowel obstruction (HCC) with prior history of SBO -CT showed partial SBO secondary to adhesion or stricture -Continue n.p.o. status, IV fluids.  NGT placed yesterday evening -Surgery following, will follow recommendations.   -No BM since Friday, will try Dulcolax pulsatory, continue NGT to suction.  Active Problems:   Benign essential HTN -Continue IV hydralazine as needed with parameters.    Hyperlipidemia -Not on any statins outpatient  Code Status: Full CODE STATUS  DVT Prophylaxis:  enoxaparin (LOVENOX) injection 40 mg Start: 02/22/21 1715   Level of Care: Level of care: Med-Surg Family Communication: Discussed all imaging results, lab results, explained to the patient's daughter, Ms. Gilmore LarocheLatoya Mcchristian on phone#[phone]   Disposition Plan:     Status is: Inpatient   IV treatments appropriate due to intensity of  illness or inability to take PO  Dispo: The patient is from: Home              Anticipated d/c is to: Home              Patient currently is not medically stable to d/c.  Currently n.p.o. on NGT suction, not stable to discharge   difficult to place patient No      Time Spent in minutes 25 minutes  Procedures:  None  Consultants:   General surgery  Antimicrobials:   Anti-infectives (From admission, onward)   None         Medications  Scheduled Meds: . bisacodyl  10 mg Rectal Daily  . diatrizoate meglumine-sodium  90 mL Per NG tube Once  . enoxaparin (LOVENOX) injection  40 mg Subcutaneous Daily  . lip balm  1 application Topical BID   Continuous Infusions: . lactated ringers    . lactated ringers 100 mL/hr at 02/23/21 1514  . methocarbamol (ROBAXIN) IV    . ondansetron (ZOFRAN) IV     PRN Meds:.acetaminophen, alum & mag hydroxide-simeth, diphenhydrAMINE, hydrALAZINE, lactated ringers, magic mouthwash,  menthol-cetylpyridinium, methocarbamol (ROBAXIN) IV, morphine injection, ondansetron (ZOFRAN) IV **OR** ondansetron (ZOFRAN) IV, phenol, simethicone      Subjective:   Joyce Price was seen and examined today.  Patient started having vomiting yesterday evening and NGT was placed.  Currently no abdominal pain.  No BM since Friday.  Patient denies dizziness, chest pain, shortness of breath.. No acute events overnight.  No fevers  Objective:   Vitals:   02/23/21 1355 02/23/21 1800 02/23/21 2204 02/24/21 0605  BP: (!) 157/78 (!) 155/75 (!) 157/76 (!) 154/75  Pulse: 71 68 78 65  Resp: 16 16 18 18   Temp: 99.1 F (37.3 C) 98.7 F (37.1 C) 99 F (37.2 C) 98.8 F (37.1 C)  TempSrc: Oral Oral Oral Oral  SpO2: 100% 100% 97% 99%  Weight:    71.7 kg  Height:    5\' 7"  (1.702 m)    Intake/Output Summary (Last 24 hours) at 02/24/2021 1126 Last data filed at 02/24/2021 0929 Gross per 24 hour  Intake 80 ml  Output 600 ml  Net -520 ml     Wt Readings from Last  3 Encounters:  02/24/21 71.7 kg  02/20/21 71.7 kg  12/01/18 69.1 kg   Physical Exam  General: Alert and oriented x 3, NAD, NGT+  Cardiovascular: S1 S2 clear, RRR. No pedal edema b/l  Respiratory: CTAB, no wheezing, rales or rhonchi  Gastrointestinal: Soft, nontender,+ BS  Ext: no pedal edema bilaterally  Neuro: no new deficits  Musculoskeletal: No cyanosis, clubbing  Skin: No rashes  Psych: Normal affect and demeanor, alert and oriented x3      Data Reviewed:  I have personally reviewed following labs and imaging studies  Micro Results Recent Results (from the past 240 hour(s))  Respiratory Panel by RT PCR (Flu A&B, Covid) -     Status: None   Collection Time: 02/22/21  5:21 PM  Result Value Ref Range Status   SARS Coronavirus 2 by RT PCR NEGATIVE NEGATIVE Final   Influenza A by PCR NEGATIVE NEGATIVE Final   Influenza B by PCR NEGATIVE NEGATIVE Final    Comment: Performed at [facility], [address] [place], [place]    Radiology Reports DG Abd 1 View  Result Date: 02/23/2021 CLINICAL DATA:  Enteric catheter placement EXAM: ABDOMEN - 1 VIEW COMPARISON:  02/23/2021, 02/22/2021 FINDINGS: Supine frontal views of the abdomen and pelvis are obtained. Enteric catheter passes below diaphragm, tip overlying the gastric fundus. The enteric catheter appears kinked at the level of the side port. Please correlate with catheter function. Continued gaseous distention of the small bowel, measuring up to 4.5 cm. Findings are consistent with small-bowel obstruction. Stable uterine calcifications within the pelvis. IMPRESSION: 1. Enteric catheter tip overlying gastric fundus. The catheter appears kinked at the level of the side port. Please correlate with catheter function. 2. Persistent small bowel obstruction. Electronically Signed   By: 02/25/2021 M.D.   On: 02/23/2021 22:12   DG Abd 1 View  Result Date: 02/23/2021 CLINICAL DATA:  NG tube  placement EXAM: ABDOMEN - 1 VIEW COMPARISON:  CT 02/22/2021 FINDINGS: Lung bases are clear. Esophageal tube is coiled within the stomach, the tip projects over the mid gastric region. Dilated loops of bowel in the upper abdomen concerning for an obstruction. IMPRESSION: Esophageal tube coiled within the stomach, tip projects over the gastric body. Dilated loops of small bowel consistent with obstruction. Electronically Signed   By: 02/25/2021 M.D.   On: 02/23/2021  20:15   CT Abdomen Pelvis W Contrast  Result Date: 02/22/2021 CLINICAL DATA:  History of bowel adhesions, obstruction 25 years ago. Nausea, abdominal discomfort EXAM: CT ABDOMEN AND PELVIS WITH CONTRAST TECHNIQUE: Multidetector CT imaging of the abdomen and pelvis was performed using the standard protocol following bolus administration of intravenous contrast. CONTRAST:  OMNIPAQUE IOHEXOL 300 MG/ML  SOLN COMPARISON:  None. FINDINGS: Lower chest: No acute abnormality. Hepatobiliary: No solid liver abnormality is seen. No gallstones, gallbladder wall thickening, or biliary dilatation. Pancreas: Unremarkable. No pancreatic ductal dilatation or surrounding inflammatory changes. Spleen: Normal in size without significant abnormality. Adrenals/Urinary Tract: Adrenal glands are unremarkable. Kidneys are normal, without renal calculi, solid lesion, or hydronephrosis. Bladder is unremarkable. Stomach/Bowel: Stomach is within normal limits. Appendix appears normal. The mid to distal small bowel is mildly distended and fluid-filled, largest loops measuring up to 3.6 cm. There is an abrupt caliber change in the right lower quadrant, with decompression of the remaining loops of terminal ileum (series 2, image 54, series 5, image 62). There is stool and scattered gas present in the colon. Vascular/Lymphatic: No significant vascular findings are present. No enlarged abdominal or pelvic lymph nodes. Reproductive: Calcified uterine fibroids. Other: No  abdominal wall hernia or abnormality. No abdominopelvic ascites. Musculoskeletal: No acute or significant osseous findings. IMPRESSION: The mid to distal small bowel is mildly distended and fluid-filled, largest loops measuring up to 3.6 cm. There is an abrupt caliber change in the right lower quadrant, with decompression of the remaining loops of terminal ileum. Findings are consistent with at least partial small bowel obstruction secondary to adhesion or stricture. Electronically Signed   By: Lauralyn Primes M.D.   On: 02/22/2021 15:02    Lab Data:  CBC: Recent Labs  Lab 02/20/21 1044 02/22/21 1305 02/23/21 0557 02/24/21 0435  WBC 14.6* 9.2 6.7 7.3  NEUTROABS 12.7*  --   --   --   HGB 16.3* 15.0 14.4 12.5  HCT 47.9* 43.7 41.8 36.5  MCV 88 88.5 88.2 88.4  PLT 443 383 295 294   Basic Metabolic Panel: Recent Labs  Lab 02/20/21 1044 02/22/21 1305 02/23/21 0557 02/24/21 0435  NA 136 135 138 140  K 3.8 3.4* 3.2* 3.3*  CL 93* 94* 98 101  CO2 25 27 26 27   GLUCOSE 110* 118* 98 87  BUN 21 27* 25* 25*  CREATININE 1.09* 1.03* 0.99 0.82  CALCIUM 10.4* 9.7 8.9 8.9   GFR: Estimated Creatinine Clearance: 63.9 mL/min (by C-G formula based on SCr of 0.82 mg/dL). Liver Function Tests: Recent Labs  Lab 02/20/21 1044 02/22/21 1305  AST 29 34  ALT 20 30  ALKPHOS 82 57  BILITOT 1.0 1.6*  PROT 8.1 8.1  ALBUMIN 4.6 4.0   Recent Labs  Lab 02/20/21 1044 02/22/21 1305  LIPASE  --  20  AMYLASE 63  --    No results for input(s): AMMONIA in the last 168 hours. Coagulation Profile: No results for input(s): INR, PROTIME in the last 168 hours. Cardiac Enzymes: No results for input(s): CKTOTAL, CKMB, CKMBINDEX, TROPONINI in the last 168 hours. BNP (last 3 results) No results for input(s): PROBNP in the last 8760 hours. HbA1C: No results for input(s): HGBA1C in the last 72 hours. CBG: No results for input(s): GLUCAP in the last 168 hours. Lipid Profile: No results for input(s): CHOL,  HDL, LDLCALC, TRIG, CHOLHDL, LDLDIRECT in the last 72 hours. Thyroid Function Tests: No results for input(s): TSH, T4TOTAL, FREET4, T3FREE, THYROIDAB in the  last 72 hours. Anemia Panel: No results for input(s): VITAMINB12, FOLATE, FERRITIN, TIBC, IRON, RETICCTPCT in the last 72 hours. Urine analysis:    Component Value Date/Time   BILIRUBINUR small (A) 02/20/2021 1012   KETONESUR small (15) (A) 02/20/2021 1012   PROTEINUR =100 (A) 02/20/2021 1012   UROBILINOGEN 0.2 02/20/2021 1012   NITRITE Negative 02/20/2021 1012   LEUKOCYTESUR Negative 02/20/2021 1012     Ripudeep Rai M.D. [facility] 02/24/2021, 11:26 AM  Available via Epic secure chat 7am-7pm After 7 pm, please refer to night coverage provider listed on amion.

## 2021-02-25 ENCOUNTER — Encounter (HOSPITAL_COMMUNITY): Payer: Self-pay | Admitting: Internal Medicine

## 2021-02-25 DIAGNOSIS — Z8639 Personal history of other endocrine, nutritional and metabolic disease: Secondary | ICD-10-CM | POA: Insufficient documentation

## 2021-02-25 DIAGNOSIS — M2010 Hallux valgus (acquired), unspecified foot: Secondary | ICD-10-CM | POA: Insufficient documentation

## 2021-02-25 DIAGNOSIS — E7800 Pure hypercholesterolemia, unspecified: Secondary | ICD-10-CM | POA: Insufficient documentation

## 2021-02-25 DIAGNOSIS — E559 Vitamin D deficiency, unspecified: Secondary | ICD-10-CM | POA: Insufficient documentation

## 2021-02-25 DIAGNOSIS — R9431 Abnormal electrocardiogram [ECG] [EKG]: Secondary | ICD-10-CM | POA: Insufficient documentation

## 2021-02-25 DIAGNOSIS — M5430 Sciatica, unspecified side: Secondary | ICD-10-CM | POA: Insufficient documentation

## 2021-02-25 DIAGNOSIS — L255 Unspecified contact dermatitis due to plants, except food: Secondary | ICD-10-CM | POA: Insufficient documentation

## 2021-02-25 DIAGNOSIS — Z0189 Encounter for other specified special examinations: Secondary | ICD-10-CM

## 2021-02-25 DIAGNOSIS — Z780 Asymptomatic menopausal state: Secondary | ICD-10-CM | POA: Insufficient documentation

## 2021-02-25 LAB — BASIC METABOLIC PANEL
Anion gap: 12 (ref 5–15)
BUN: 24 mg/dL — ABNORMAL HIGH (ref 8–23)
CO2: 27 mmol/L (ref 22–32)
Calcium: 8.4 mg/dL — ABNORMAL LOW (ref 8.9–10.3)
Chloride: 102 mmol/L (ref 98–111)
Creatinine, Ser: 0.85 mg/dL (ref 0.44–1.00)
GFR, Estimated: >60 mL/min (ref >60)
Glucose, Bld: 89 mg/dL (ref 70–99)
Potassium: 3.6 mmol/L (ref 3.5–5.1)
Sodium: 141 mmol/L (ref 135–145)

## 2021-02-25 LAB — CBC
HCT: 35.4 % — ABNORMAL LOW (ref 36.0–46.0)
Hemoglobin: 11.9 g/dL — ABNORMAL LOW (ref 12.0–15.0)
MCH: 30.1 pg (ref 26.0–34.0)
MCHC: 33.6 g/dL (ref 30.0–36.0)
MCV: 89.6 fL (ref 80.0–100.0)
Platelets: 294 10*3/uL (ref 150–400)
RBC: 3.95 MIL/uL (ref 3.87–5.11)
RDW: 12.9 % (ref 11.5–15.5)
WBC: 8.5 10*3/uL (ref 4.0–10.5)
nRBC: 0 % (ref 0.0–0.2)

## 2021-02-25 NOTE — Progress Notes (Signed)
Subjective: + flatus and BM overnight, but had some nausea and 1 episode of emesis last night around NGT.  Feels well this morning.  Pain and distention improved per patient.    ROS: See above, otherwise other systems negative  Objective: Vital signs in last 24 hours: Temp:  [97.9 F (36.6 C)-98.3 F (36.8 C)] 98.3 F (36.8 C) (03/15 0609) Pulse Rate:  [62-75] 63 (03/15 0609) Resp:  [16-18] 16 (03/15 0609) BP: (147-167)/(62-69) 151/62 (03/15 0609) SpO2:  [99 %-100 %] 99 % (03/15 0609) Last BM Date: 02/24/21  Intake/Output from previous day: 03/14 0701 - 03/15 0700 In: 3795.9 [P.O.:150; I.V.:3345.9; IV Piggyback:300] Out: 800 [Emesis/NG output:800] Intake/Output this shift: No intake/output data recorded.  PE: Heart: regular Lungs: CTAB Abd: soft, non tender, ND, +BS, NGT with minimal output currently.  Lab Results:  Recent Labs    02/24/21 0435 02/25/21 0517  WBC 7.3 8.5  HGB 12.5 11.9*  HCT 36.5 35.4*  PLT 294 294   BMET Recent Labs    02/24/21 0435 02/25/21 0517  NA 140 141  K 3.3* 3.6  CL 101 102  CO2 27 27  GLUCOSE 87 89  BUN 25* 24*  CREATININE 0.82 0.85  CALCIUM 8.9 8.4*   PT/INR No results for input(s): LABPROT, INR in the last 72 hours. CMP     Component Value Date/Time   NA 141 02/25/2021 0517   NA 136 02/20/2021 1044   K 3.6 02/25/2021 0517   CL 102 02/25/2021 0517   CO2 27 02/25/2021 0517   GLUCOSE 89 02/25/2021 0517   BUN 24 (H) 02/25/2021 0517   BUN 21 02/20/2021 1044   CREATININE 0.85 02/25/2021 0517   CALCIUM 8.4 (L) 02/25/2021 0517   PROT 8.1 02/22/2021 1305   PROT 8.1 02/20/2021 1044   ALBUMIN 4.0 02/22/2021 1305   ALBUMIN 4.6 02/20/2021 1044   AST 34 02/22/2021 1305   ALT 30 02/22/2021 1305   ALKPHOS 57 02/22/2021 1305   BILITOT 1.6 (H) 02/22/2021 1305   BILITOT 1.0 02/20/2021 1044   GFRNONAA >60 02/25/2021 0517   Lipase     Component Value Date/Time   LIPASE 20 02/22/2021 1305        Studies/Results: DG Abd 1 View  Result Date: 02/23/2021 CLINICAL DATA:  Enteric catheter placement EXAM: ABDOMEN - 1 VIEW COMPARISON:  02/23/2021, 02/22/2021 FINDINGS: Supine frontal views of the abdomen and pelvis are obtained. Enteric catheter passes below diaphragm, tip overlying the gastric fundus. The enteric catheter appears kinked at the level of the side port. Please correlate with catheter function. Continued gaseous distention of the small bowel, measuring up to 4.5 cm. Findings are consistent with small-bowel obstruction. Stable uterine calcifications within the pelvis. IMPRESSION: 1. Enteric catheter tip overlying gastric fundus. The catheter appears kinked at the level of the side port. Please correlate with catheter function. 2. Persistent small bowel obstruction. Electronically Signed   By: Sharlet Salina M.D.   On: 02/23/2021 22:12   DG Abd 1 View  Result Date: 02/23/2021 CLINICAL DATA:  NG tube placement EXAM: ABDOMEN - 1 VIEW COMPARISON:  CT 02/22/2021 FINDINGS: Lung bases are clear. Esophageal tube is coiled within the stomach, the tip projects over the mid gastric region. Dilated loops of bowel in the upper abdomen concerning for an obstruction. IMPRESSION: Esophageal tube coiled within the stomach, tip projects over the gastric body. Dilated loops of small bowel consistent with obstruction. Electronically Signed   By: Jasmine Pang  M.D.   On: 02/23/2021 20:15   DG Abd Portable 1V-Small Bowel Obstruction Protocol-initial, 8 hr delay  Result Date: 02/24/2021 CLINICAL DATA:  8 hour delay small bowel protocol EXAM: PORTABLE ABDOMEN - 1 VIEW COMPARISON:  02/23/2021, CT 02/22/2021 FINDINGS: Persistent gaseous dilatation of central small bowel up to 4.6 cm. Possible dilute contrast within right abdominal bowel loops, no definitive colon contrast is evident. Calcified fibroids in the pelvis. IMPRESSION: Questionable dilute contrast within right abdominal bowel loops, no definitive  colon or rectal contrast is evident. Persistent gaseous dilatation of small bowel consistent with obstruction Electronically Signed   By: Jasmine Pang M.D.   On: 02/24/2021 20:40    Anti-infectives: Anti-infectives (From admission, onward)   None       Assessment/Plan HTN HLD  SBO -will clamp NGT today and let her try some clear liquids -mobilize -if she develops N/V return NGT to LIWS  FEN - CLD, NGT clamped/IVFs VTE - Lovenox ID - none   LOS: 1 day    Letha Cape , [facility] 02/25/2021, 9:14 AM Please see Amion for pager number during day hours 7:00am-4:30pm or 7:00am -11:30am on weekends

## 2021-02-25 NOTE — Progress Notes (Signed)
[facility]                                                                              Patient Demographics  Joyce LintWilma Price, is a 70 y.o. female, DOB - 12-13-1951, ZOX:[id number]  Admit date - 02/22/2021   Admitting Physician Jae DireJared E Segal, MD  Outpatient Primary MD for the patient is Merri BrunettePharr, Walter, MD  Outpatient specialists:   LOS - 1  days   Medical records reviewed and are as summarized below:    Chief Complaint  Patient presents with  . Abdominal Pain  . Emesis       Brief summary   Patient is a 70 year old female with past medical history of hypertension, hyperlipidemia, remote history of SBO, required unknown abdominal surgery presented to ED with 5 days of generalized abdominal pain, nausea vomiting and constipation.  She was seen in urgent care several days ago and was prescribed Colace and MiraLAX with no improvement.  She was unable to keep any food down and had abdominal bloating.  Per patient she had BM Saturday morning, since then has not passed flatus or BM.  3/13: General surgery consulted, NGT placed to IWS, n.p.o., IV fluids 3/15: Trial of NGT clamping today and allow some clears  Assessment & Plan    Principal Problem:   Partial small bowel obstruction (HCC) with prior history of SBO -CT showed partial SBO secondary to adhesion or stricture -General surgery following. -Had a BM overnight, feels less bloated today. - clamp NGT today, clear liquids -Currently ambulating in the hallway for mobility  Active Problems:   Benign essential HTN -BP readings elevated, for now continue IV hydralazine as needed with parameters  - will start olmesartan HCTZ once tolerating p.o.    Hyperlipidemia -Not on any statins outpatient  Code Status: Full CODE STATUS  DVT Prophylaxis:  enoxaparin (LOVENOX) injection 40 mg Start: 02/22/21 1715   Level of Care: Level of care: Med-Surg Family Communication: Discussed all imaging results, lab results,  explained to the patient's daughter, Ms. Gilmore LarocheLatoya Price at the bedside   Disposition Plan:     Status is: Inpatient   IV treatments appropriate due to intensity of illness or inability to take PO  Dispo: The patient is from: Home              Anticipated d/c is to: Home              Patient currently is not medically stable to d/c.  NGT clamp today, starting clears.  Hopefully DC next 24 to 48 hours if SBO resolved and tolerating p.o.    difficult to place patient No      Time Spent in minutes 25 minutes  Procedures:  None  Consultants:   General surgery  Antimicrobials:   Anti-infectives (From admission, onward)   None         Medications  Scheduled Meds: . bisacodyl  10 mg Rectal Daily  . enoxaparin (LOVENOX) injection  40 mg Subcutaneous Daily  . lip balm  1 application Topical BID   Continuous Infusions: . lactated ringers    . lactated ringers 100 mL/hr  at 02/25/21 0830  . methocarbamol (ROBAXIN) IV    . ondansetron (ZOFRAN) IV     PRN Meds:.acetaminophen, alum & mag hydroxide-simeth, diphenhydrAMINE, hydrALAZINE, lactated ringers, magic mouthwash, menthol-cetylpyridinium, methocarbamol (ROBAXIN) IV, morphine injection, ondansetron (ZOFRAN) IV **OR** ondansetron (ZOFRAN) IV, phenol, simethicone      Subjective:   Joyce Price was seen and examined today.  States had a BM last night, feels better and less bloated today.  Ambulating in the hallway.  No abdominal pain.  No fevers.  No acute nausea or vomiting.  NG clamped.  Patient denies dizziness, chest pain, shortness of breath.. No acute events overnight Objective:   Vitals:   02/24/21 1318 02/24/21 1722 02/25/21 0136 02/25/21 0609  BP: (!) 167/69 (!) 167/69 (!) 147/68 (!) 151/62  Pulse: 62 75 70 63  Resp:   18 16  Temp: 97.9 F (36.6 C)  98.2 F (36.8 C) 98.3 F (36.8 C)  TempSrc: Oral  Oral Oral  SpO2: 100%  99% 99%  Weight:      Height:        Intake/Output Summary (Last 24 hours) at  02/25/2021 1224 Last data filed at 02/25/2021 1005 Gross per 24 hour  Intake 1944.59 ml  Output 700 ml  Net 1244.59 ml     Wt Readings from Last 3 Encounters:  02/24/21 71.7 kg  02/20/21 71.7 kg  12/01/18 69.1 kg   Physical Exam  General: Alert and oriented x 3, NGT + clamped   Cardiovascular: S1 S2 clear, RRR. No pedal edema b/l  Respiratory: CTAB, no wheezing, rales or rhonchi  Gastrointestinal: Soft, nontender, nondistended, NBS  Ext: no pedal edema bilaterally  Neuro: no new deficits  Musculoskeletal: No cyanosis, clubbing  Skin: No rashes  Psych: Normal affect and demeanor, alert and oriented x3    Data Reviewed:  I have personally reviewed following labs and imaging studies  Micro Results Recent Results (from the past 240 hour(s))  Respiratory Panel by RT PCR (Flu A&B, Covid) -     Status: None   Collection Time: 02/22/21  5:21 PM  Result Value Ref Range Status   SARS Coronavirus 2 by RT PCR NEGATIVE NEGATIVE Final   Influenza A by PCR NEGATIVE NEGATIVE Final   Influenza B by PCR NEGATIVE NEGATIVE Final    Comment: Performed at [facility], [address], [place], [place] [address]    Radiology Reports DG Abd 1 View  Result Date: 02/23/2021 CLINICAL DATA:  Enteric catheter placement EXAM: ABDOMEN - 1 VIEW COMPARISON:  02/23/2021, 02/22/2021 FINDINGS: Supine frontal views of the abdomen and pelvis are obtained. Enteric catheter passes below diaphragm, tip overlying the gastric fundus. The enteric catheter appears kinked at the level of the side port. Please correlate with catheter function. Continued gaseous distention of the small bowel, measuring up to 4.5 cm. Findings are consistent with small-bowel obstruction. Stable uterine calcifications within the pelvis. IMPRESSION: 1. Enteric catheter tip overlying gastric fundus. The catheter appears kinked at the level of the side port. Please correlate with catheter function. 2. Persistent small  bowel obstruction. Electronically Signed   By: Sharlet Salina M.D.   On: 02/23/2021 22:12   DG Abd 1 View  Result Date: 02/23/2021 CLINICAL DATA:  NG tube placement EXAM: ABDOMEN - 1 VIEW COMPARISON:  CT 02/22/2021 FINDINGS: Lung bases are clear. Esophageal tube is coiled within the stomach, the tip projects over the mid gastric region. Dilated loops of bowel in the upper abdomen concerning for an obstruction.  IMPRESSION: Esophageal tube coiled within the stomach, tip projects over the gastric body. Dilated loops of small bowel consistent with obstruction. Electronically Signed   By: Jasmine Pang M.D.   On: 02/23/2021 20:15   CT Abdomen Pelvis W Contrast  Result Date: 02/22/2021 CLINICAL DATA:  History of bowel adhesions, obstruction 25 years ago. Nausea, abdominal discomfort EXAM: CT ABDOMEN AND PELVIS WITH CONTRAST TECHNIQUE: Multidetector CT imaging of the abdomen and pelvis was performed using the standard protocol following bolus administration of intravenous contrast. CONTRAST:  OMNIPAQUE IOHEXOL 300 MG/ML  SOLN COMPARISON:  None. FINDINGS: Lower chest: No acute abnormality. Hepatobiliary: No solid liver abnormality is seen. No gallstones, gallbladder wall thickening, or biliary dilatation. Pancreas: Unremarkable. No pancreatic ductal dilatation or surrounding inflammatory changes. Spleen: Normal in size without significant abnormality. Adrenals/Urinary Tract: Adrenal glands are unremarkable. Kidneys are normal, without renal calculi, solid lesion, or hydronephrosis. Bladder is unremarkable. Stomach/Bowel: Stomach is within normal limits. Appendix appears normal. The mid to distal small bowel is mildly distended and fluid-filled, largest loops measuring up to 3.6 cm. There is an abrupt caliber change in the right lower quadrant, with decompression of the remaining loops of terminal ileum (series 2, image 54, series 5, image 62). There is stool and scattered gas present in the colon.  Vascular/Lymphatic: No significant vascular findings are present. No enlarged abdominal or pelvic lymph nodes. Reproductive: Calcified uterine fibroids. Other: No abdominal wall hernia or abnormality. No abdominopelvic ascites. Musculoskeletal: No acute or significant osseous findings. IMPRESSION: The mid to distal small bowel is mildly distended and fluid-filled, largest loops measuring up to 3.6 cm. There is an abrupt caliber change in the right lower quadrant, with decompression of the remaining loops of terminal ileum. Findings are consistent with at least partial small bowel obstruction secondary to adhesion or stricture. Electronically Signed   By: Lauralyn Primes M.D.   On: 02/22/2021 15:02   DG Abd Portable 1V-Small Bowel Obstruction Protocol-initial, 8 hr delay  Result Date: 02/24/2021 CLINICAL DATA:  8 hour delay small bowel protocol EXAM: PORTABLE ABDOMEN - 1 VIEW COMPARISON:  02/23/2021, CT 02/22/2021 FINDINGS: Persistent gaseous dilatation of central small bowel up to 4.6 cm. Possible dilute contrast within right abdominal bowel loops, no definitive colon contrast is evident. Calcified fibroids in the pelvis. IMPRESSION: Questionable dilute contrast within right abdominal bowel loops, no definitive colon or rectal contrast is evident. Persistent gaseous dilatation of small bowel consistent with obstruction Electronically Signed   By: Jasmine Pang M.D.   On: 02/24/2021 20:40    Lab Data:  CBC: Recent Labs  Lab 02/20/21 1044 02/22/21 1305 02/23/21 0557 02/24/21 0435 02/25/21 0517  WBC 14.6* 9.2 6.7 7.3 8.5  NEUTROABS 12.7*  --   --   --   --   HGB 16.3* 15.0 14.4 12.5 11.9*  HCT 47.9* 43.7 41.8 36.5 35.4*  MCV 88 88.5 88.2 88.4 89.6  PLT 443 383 295 294 294   Basic Metabolic Panel: Recent Labs  Lab 02/20/21 1044 02/22/21 1305 02/23/21 0557 02/24/21 0435 02/25/21 0517  NA 136 135 138 140 141  K 3.8 3.4* 3.2* 3.3* 3.6  CL 93* 94* 98 101 102  CO2 25 27 26 27 27   GLUCOSE 110*  118* 98 87 89  BUN 21 27* 25* 25* 24*  CREATININE 1.09* 1.03* 0.99 0.82 0.85  CALCIUM 10.4* 9.7 8.9 8.9 8.4*   GFR: Estimated Creatinine Clearance: 61.6 mL/min (by C-G formula based on SCr of 0.85 mg/dL). Liver Function Tests:  Recent Labs  Lab 02/20/21 1044 02/22/21 1305  AST 29 34  ALT 20 30  ALKPHOS 82 57  BILITOT 1.0 1.6*  PROT 8.1 8.1  ALBUMIN 4.6 4.0   Recent Labs  Lab 02/20/21 1044 02/22/21 1305  LIPASE  --  20  AMYLASE 63  --    No results for input(s): AMMONIA in the last 168 hours. Coagulation Profile: No results for input(s): INR, PROTIME in the last 168 hours. Cardiac Enzymes: No results for input(s): CKTOTAL, CKMB, CKMBINDEX, TROPONINI in the last 168 hours. BNP (last 3 results) No results for input(s): PROBNP in the last 8760 hours. HbA1C: No results for input(s): HGBA1C in the last 72 hours. CBG: No results for input(s): GLUCAP in the last 168 hours. Lipid Profile: No results for input(s): CHOL, HDL, LDLCALC, TRIG, CHOLHDL, LDLDIRECT in the last 72 hours. Thyroid Function Tests: No results for input(s): TSH, T4TOTAL, FREET4, T3FREE, THYROIDAB in the last 72 hours. Anemia Panel: No results for input(s): VITAMINB12, FOLATE, FERRITIN, TIBC, IRON, RETICCTPCT in the last 72 hours. Urine analysis:    Component Value Date/Time   BILIRUBINUR small (A) 02/20/2021 1012   KETONESUR small (15) (A) 02/20/2021 1012   PROTEINUR =100 (A) 02/20/2021 1012   UROBILINOGEN 0.2 02/20/2021 1012   NITRITE Negative 02/20/2021 1012   LEUKOCYTESUR Negative 02/20/2021 1012     Joyce Price M.D. [facility] 02/25/2021, 12:24 PM  Available via Epic secure chat 7am-7pm After 7 pm, please refer to night coverage provider listed on amion.

## 2021-02-25 NOTE — Progress Notes (Signed)
Patient having some nausea, prn zofran given, ng tube flushed easily with air into both clear and blue ports, LIWS resumed, patient denies need for pain medication at this time, asked again if patient would like to ambulate in the hallway once zofran was effective but patient declined, will continue to monitor.

## 2021-02-26 ENCOUNTER — Inpatient Hospital Stay (HOSPITAL_COMMUNITY): Payer: Medicare Other

## 2021-02-26 LAB — CBC
HCT: 35.5 % — ABNORMAL LOW (ref 36.0–46.0)
Hemoglobin: 11.8 g/dL — ABNORMAL LOW (ref 12.0–15.0)
MCH: 29.7 pg (ref 26.0–34.0)
MCHC: 33.2 g/dL (ref 30.0–36.0)
MCV: 89.4 fL (ref 80.0–100.0)
Platelets: 291 10*3/uL (ref 150–400)
RBC: 3.97 MIL/uL (ref 3.87–5.11)
RDW: 12.7 % (ref 11.5–15.5)
WBC: 7 10*3/uL (ref 4.0–10.5)
nRBC: 0 % (ref 0.0–0.2)

## 2021-02-26 LAB — BASIC METABOLIC PANEL
Anion gap: 7 (ref 5–15)
BUN: 17 mg/dL (ref 8–23)
CO2: 27 mmol/L (ref 22–32)
Calcium: 8.1 mg/dL — ABNORMAL LOW (ref 8.9–10.3)
Chloride: 103 mmol/L (ref 98–111)
Creatinine, Ser: 0.89 mg/dL (ref 0.44–1.00)
GFR, Estimated: >60 mL/min (ref >60)
Glucose, Bld: 112 mg/dL — ABNORMAL HIGH (ref 70–99)
Potassium: 3.5 mmol/L (ref 3.5–5.1)
Sodium: 137 mmol/L (ref 135–145)

## 2021-02-26 MED ORDER — OLMESARTAN MEDOXOMIL-HCTZ 40-25 MG PO TABS: 1.0000 | ORAL_TABLET | Freq: Every day | ORAL | Status: DC

## 2021-02-26 MED ORDER — IRBESARTAN 150 MG PO TABS
300.0000 mg | ORAL_TABLET | Freq: Every day | ORAL | Status: DC
  Administered 2021-02-27: 300 mg via ORAL
  Filled 2021-02-26: qty 2

## 2021-02-26 MED ORDER — HYDROCHLOROTHIAZIDE 25 MG PO TABS
25.0000 mg | ORAL_TABLET | Freq: Every day | ORAL | Status: DC
  Administered 2021-02-27: 25 mg via ORAL
  Filled 2021-02-26: qty 1

## 2021-02-26 NOTE — Progress Notes (Signed)
       Subjective: Had several BMs since yesterday.  No nausea with NGT clamped.  Tolerating CLD  ROS: See above, otherwise other systems negative  Objective: Vital signs in last 24 hours: Temp:  [98.4 F (36.9 C)-98.8 F (37.1 C)] 98.4 F (36.9 C) (03/16 0443) Pulse Rate:  [63-65] 63 (03/16 0443) Resp:  [17-18] 17 (03/16 0443) BP: (139-177)/(70-76) 147/76 (03/16 0443) SpO2:  [99 %-100 %] 99 % (03/16 0443) Last BM Date: 02/26/21  Intake/Output from previous day: 03/15 0701 - 03/16 0700 In: 3512.7 [P.O.:1140; I.V.:2372.7] Out: 0  Intake/Output this shift: No intake/output data recorded.  PE: Heart: regular Lungs: CTAB Abd: soft, non tender, ND, +BS, NGT clamped  Lab Results:  Recent Labs    02/25/21 0517 02/26/21 0437  WBC 8.5 7.0  HGB 11.9* 11.8*  HCT 35.4* 35.5*  PLT 294 291   BMET Recent Labs    02/25/21 0517 02/26/21 0437  NA 141 137  K 3.6 3.5  CL 102 103  CO2 27 27  GLUCOSE 89 112*  BUN 24* 17  CREATININE 0.85 0.89  CALCIUM 8.4* 8.1*   PT/INR No results for input(s): LABPROT, INR in the last 72 hours. CMP     Component Value Date/Time   NA 137 02/26/2021 0437   NA 136 02/20/2021 1044   K 3.5 02/26/2021 0437   CL 103 02/26/2021 0437   CO2 27 02/26/2021 0437   GLUCOSE 112 (H) 02/26/2021 0437   BUN 17 02/26/2021 0437   BUN 21 02/20/2021 1044   CREATININE 0.89 02/26/2021 0437   CALCIUM 8.1 (L) 02/26/2021 0437   PROT 8.1 02/22/2021 1305   PROT 8.1 02/20/2021 1044   ALBUMIN 4.0 02/22/2021 1305   ALBUMIN 4.6 02/20/2021 1044   AST 34 02/22/2021 1305   ALT 30 02/22/2021 1305   ALKPHOS 57 02/22/2021 1305   BILITOT 1.6 (H) 02/22/2021 1305   BILITOT 1.0 02/20/2021 1044   GFRNONAA >60 02/26/2021 0437   Lipase     Component Value Date/Time   LIPASE 20 02/22/2021 1305       Studies/Results: DG Abd Portable 1V-Small Bowel Obstruction Protocol-initial, 8 hr delay  Result Date: 02/24/2021 CLINICAL DATA:  8 hour delay small bowel  protocol EXAM: PORTABLE ABDOMEN - 1 VIEW COMPARISON:  02/23/2021, CT 02/22/2021 FINDINGS: Persistent gaseous dilatation of central small bowel up to 4.6 cm. Possible dilute contrast within right abdominal bowel loops, no definitive colon contrast is evident. Calcified fibroids in the pelvis. IMPRESSION: Questionable dilute contrast within right abdominal bowel loops, no definitive colon or rectal contrast is evident. Persistent gaseous dilatation of small bowel consistent with obstruction Electronically Signed   By: Jasmine Pang M.D.   On: 02/24/2021 20:40    Anti-infectives: Anti-infectives (From admission, onward)   None       Assessment/Plan HTN HLD  SBO -DC NGT -FLD diet, soft in am.  If tolerates can dc home tomorrow.  FEN - FLD VTE - Lovenox ID - none   LOS: 2 days    Joyce Price , [facility] 02/26/2021, 9:35 AM Please see Amion for pager number during day hours 7:00am-4:30pm or 7:00am -11:30am on weekends

## 2021-02-26 NOTE — Progress Notes (Signed)
PROGRESS NOTE    Joyce Price  PYK:[id number] DOB: [birth year] DOA: 02/22/2021 PCP: Merri Brunette, MD   Brief Narrative: Joyce Price is a 70 y.o. female with a history of hypertension, hyperlipidemia, SBO s/p surgical management. Patient presented secondary to abdominal pain and emesis in setting of partial small bowel obstruction seen on CT abdomen/pelvis. NG tube placed. General surgery consulted. Conservative treatment.   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 CT abdomen/pelvis on admission significant for partial SBO. General surgery consulted. NG tube placed on 3/13 and removed 3/16. -General surgery recommendations: NG tube removed today; advance diet, abdominal x-ray  Primary hypertension Patient is on olmesartan-hydrochlorothiazide as an outpatient. BP is mildly uncontrolled -Resume home olmesartan-hydrochlorothiazide   Hyperlipidemia Patient is prescribed Crestor as an outpatient for which she is not taking secondary to adverse effect of cramps. LDL of 223.   DVT prophylaxis: Lovenox Code Status:   Code Status: Full Code Family Communication: None at bedside Disposition Plan: Discharge home likely in 24 hours pending ability to advance diet successfully in addition to general surgery recommendations   Consultants:   General surgery  Procedures:   None  Antimicrobials:  None    Subjective: No nausea/vomiting this morning. Had an episode of emesis last night. NG tube removed this morning. No abdominal pain. Passing gas. Having bowel movements.  Objective: Vitals:   02/25/21 1526 02/25/21 2038 02/26/21 0443 02/26/21 1231  BP: (!) 157/73 139/70 (!) 147/76 (!) 148/79  Pulse:  65 63 67  Resp:  18 17   Temp:  98.8 F (37.1 C)  98.4 F (36.9 C) 98.3 F (36.8 C)  TempSrc:  Oral Oral Oral  SpO2:  100% 99% 100%  Weight:      Height:        Intake/Output Summary (Last 24 hours) at 02/26/2021 1235 Last data filed at 02/26/2021 0935 Gross per 24 hour  Intake 3058.13 ml  Output --  Net 3058.13 ml   Filed Weights   02/24/21 0605  Weight: 71.7 kg    Examination:  General exam: Appears calm and comfortable Respiratory system: Clear to auscultation. Respiratory effort normal. Cardiovascular system: S1 & S2 heard, RRR. No murmurs, rubs, gallops or clicks. Gastrointestinal system: Abdomen is nondistended, soft and nontender. No organomegaly or masses felt. Normal bowel sounds heard. Central nervous system: Alert and oriented. No focal neurological deficits. Musculoskeletal: No edema. No calf tenderness Skin: No cyanosis. No rashes Psychiatry: Judgement and insight appear normal. Mood & affect appropriate.     Data Reviewed: I have personally reviewed following labs and imaging studies  CBC Lab Results  Component Value Date   WBC 7.0 02/26/2021   RBC 3.97 02/26/2021   HGB 11.8 (L) 02/26/2021   HCT 35.5 (L) 02/26/2021   MCV 89.4 02/26/2021   MCH 29.7 02/26/2021   PLT 291 02/26/2021   MCHC 33.2 02/26/2021   RDW 12.7 02/26/2021   LYMPHSABS 1.0 02/20/2021   EOSABS 0.0 02/20/2021   BASOSABS 0.0 02/20/2021     Last metabolic panel Lab Results  Component Value Date   NA 137 02/26/2021   K 3.5 02/26/2021   CL 103 02/26/2021   CO2 27 02/26/2021   BUN 17 02/26/2021   CREATININE 0.89 02/26/2021   GLUCOSE 112 (H) 02/26/2021  GFRNONAA >60 02/26/2021   CALCIUM 8.1 (L) 02/26/2021   PROT 8.1 02/22/2021   ALBUMIN 4.0 02/22/2021   LABGLOB 3.5 02/20/2021   AGRATIO 1.3 02/20/2021   BILITOT 1.6 (H) 02/22/2021   ALKPHOS 57 02/22/2021   AST 34 02/22/2021   ALT 30 02/22/2021   ANIONGAP 7 02/26/2021    CBG (last 3)  No results for input(s): GLUCAP in the last 72 hours.   GFR: Estimated Creatinine  Clearance: 58.8 mL/min (by C-G formula based on SCr of 0.89 mg/dL).  Coagulation Profile: No results for input(s): INR, PROTIME in the last 168 hours.  Recent Results (from the past 240 hour(s))  Respiratory Panel by RT PCR (Flu A&B, Covid) -     Status: None   Collection Time: 02/22/21  5:21 PM  Result Value Ref Range Status   SARS Coronavirus 2 by RT PCR NEGATIVE NEGATIVE Final   Influenza A by PCR NEGATIVE NEGATIVE Final   Influenza B by PCR NEGATIVE NEGATIVE Final    Comment: Performed at [facility], [address], [place], [place] [address]        Radiology Studies: DG ABD ACUTE 2+V W 1V CHEST  Result Date: 02/26/2021 CLINICAL DATA:  History of small bowel obstruction. EXAM: DG ABDOMEN ACUTE WITH 1 VIEW CHEST COMPARISON:  CT 02/22/2021 and abdominal radiograph 02/24/2021 FINDINGS: Blunting at the right costophrenic angle appears new from the prior imaging. Findings are suggestive for small right pleural effusion. Otherwise, the lungs are clear. Heart size is normal. Negative for free air. Oral contrast has moved into the colon. There continues to be dilated loops of small bowel. Dilated loops of small bowel in the mid abdomen measure up to 3.5 cm similar to the exam from 02/24/2021. Again noted are prominent pelvic calcifications. No significant gas or contrast in the rectum. IMPRESSION: 1. Persistent dilated loops of small bowel. Oral contrast has moved into the colon. Findings are suggestive for a partial small bowel obstruction. The degree of small bowel distension has minimally changed since 02/24/2021. 2. New blunting at the right costophrenic angle and findings are suggestive for small right pleural effusion and/or atelectasis. Electronically Signed   By: Richarda Overlie M.D.   On: 02/26/2021 11:04   DG Abd Portable 1V-Small Bowel Obstruction Protocol-initial, 8 hr delay  Result Date: 02/24/2021 CLINICAL DATA:  8 hour delay small bowel protocol EXAM: PORTABLE  ABDOMEN - 1 VIEW COMPARISON:  02/23/2021, CT 02/22/2021 FINDINGS: Persistent gaseous dilatation of central small bowel up to 4.6 cm. Possible dilute contrast within right abdominal bowel loops, no definitive colon contrast is evident. Calcified fibroids in the pelvis. IMPRESSION: Questionable dilute contrast within right abdominal bowel loops, no definitive colon or rectal contrast is evident. Persistent gaseous dilatation of small bowel consistent with obstruction Electronically Signed   By: Jasmine Pang M.D.   On: 02/24/2021 20:40        Scheduled Meds: . bisacodyl  10 mg Rectal Daily  . enoxaparin (LOVENOX) injection  40 mg Subcutaneous Daily  . lip balm  1 application Topical BID   Continuous Infusions: . lactated ringers 100 mL/hr at 02/26/21 0232  . methocarbamol (ROBAXIN) IV    . ondansetron (ZOFRAN) IV       LOS: 2 days     Jacquelin Hawking, MD [facility] 02/26/2021, 12:35 PM  If 7PM-7AM, please contact night-coverage [email or web address]

## 2021-02-27 LAB — BASIC METABOLIC PANEL
Anion gap: 11 (ref 5–15)
BUN: 10 mg/dL (ref 8–23)
CO2: 23 mmol/L (ref 22–32)
Calcium: 8.4 mg/dL — ABNORMAL LOW (ref 8.9–10.3)
Chloride: 105 mmol/L (ref 98–111)
Creatinine, Ser: 0.95 mg/dL (ref 0.44–1.00)
GFR, Estimated: >60 mL/min (ref >60)
Glucose, Bld: 105 mg/dL — ABNORMAL HIGH (ref 70–99)
Potassium: 3.5 mmol/L (ref 3.5–5.1)
Sodium: 139 mmol/L (ref 135–145)

## 2021-02-27 LAB — CBC
HCT: 33 % — ABNORMAL LOW (ref 36.0–46.0)
Hemoglobin: 11.5 g/dL — ABNORMAL LOW (ref 12.0–15.0)
MCH: 30.7 pg (ref 26.0–34.0)
MCHC: 34.8 g/dL (ref 30.0–36.0)
MCV: 88.2 fL (ref 80.0–100.0)
Platelets: 287 10*3/uL (ref 150–400)
RBC: 3.74 MIL/uL — ABNORMAL LOW (ref 3.87–5.11)
RDW: 12.8 % (ref 11.5–15.5)
WBC: 5.9 10*3/uL (ref 4.0–10.5)
nRBC: 0 % (ref 0.0–0.2)

## 2021-02-27 NOTE — Discharge Instructions (Signed)
Bowel Obstruction A bowel obstruction is a blockage in the small or large bowel. The bowel, which is also called the intestine, is a long, slender tube that connects the stomach to the anus. When a person eats and drinks, food and fluids go from the mouth to the stomach to the small bowel. This is where most of the nutrients in the food and fluids are absorbed. After the small bowel, material passes through the large bowel for further absorption until any leftover material leaves the body as stool through the anus during a bowel movement. A bowel obstruction will prevent food and fluids from passing through the bowel as they normally do during digestion. The bowel can become partially or completely blocked. If this condition is not treated, it can be dangerous because the bowel could rupture. What are the causes? Common causes of this condition include:  Scar tissue (adhesions) from previous surgery or treatment with high-energy X-rays (radiation).  Recent surgery. This may cause the movements of the bowel to slow down and cause food to block the intestine.  Inflammatory bowel disease, such as Crohn's disease or diverticulitis.  Growths or tumors.  A bulging organ (hernia).  Twisting of the bowel (volvulus).  A foreign body.  Slipping of a part of the bowel into another part (intussusception).   What are the signs or symptoms? Symptoms of this condition include:  Pain in the abdomen. Depending on the degree of obstruction, pain may be: ? Mild or severe. ? Dull cramping or sharp pain. ? In one area or in the entire abdomen.  Nausea and vomiting. Vomit may be greenish or a yellow bile color.  Bloating in the abdomen.  Difficulty passing stool (constipation).  Lack of passing gas.  Frequent belching.  Diarrhea. This may occur if the obstruction is partial and runny stool is able to leak around the obstruction. How is this diagnosed? This condition may be diagnosed based on:  A  physical exam.  Medical history.  Imaging tests of the abdomen or pelvis, such as X-ray or CT scan.  Blood or urine tests. How is this treated? Treatment for this condition depends on the cause and severity of the problem. Treatment may include:  Fluids and pain medicines that are given through an IV. Your health care provider may instruct you not to eat or drink if you have nausea or vomiting.  Eating a simple diet. You may be asked to consume a clear liquid diet for several days. This allows the bowel to rest.  Placement of a small tube (nasogastric tube) into the stomach. This will relieve pain, discomfort, and nausea by removing blocked air and fluids from the stomach. It can also help the obstruction clear up faster.  Surgery. This may be required if other treatments do not work. Surgery may be required for: ? Bowel obstruction from a hernia. This can be an emergency procedure. ? Scar tissue that causes frequent or severe obstructions. Follow these instructions at home: Medicines  Take over-the-counter and prescription medicines only as told by your health care provider.  If you were prescribed an antibiotic medicine, take it as told by your health care provider. Do not stop taking the antibiotic even if you start to feel better. General instructions  Follow instructions from your health care provider about eating restrictions. You may need to avoid solid foods and consume only clear liquids until your condition improves.  Return to your normal activities as told by your health care provider. Ask your   health care provider what activities are safe for you.  Avoid sitting for a long time without moving. Get up to take short walks every 1-2 hours. This is important to improve blood flow and breathing. Ask for help if you feel weak or unsteady.  Keep all follow-up visits as told by your health care provider. This is important. How is this prevented? After having a bowel  obstruction, you are more likely to have another. You may do the following things to prevent another obstruction:  If you have a long-term (chronic) disease, pay attention to your symptoms and contact your health care provider if you have questions or concerns.  Avoid becoming constipated. To prevent or treat constipation, your health care provider may recommend that you: ? Drink enough fluid to keep your urine pale yellow. ? Take over-the-counter or prescription medicines. ? Eat foods that are high in fiber, such as beans, whole grains, and fresh fruits and vegetables. ? Limit foods that are high in fat and processed sugars, such as fried or sweet foods.  Stay active. Exercise for 30 minutes or more, 5 or more days each week. Ask your health care provider which exercises are safe for you.  Avoid stress. Find ways to reduce stress, such as meditation, exercise, or taking time for activities that relax you.  Instead of eating three large meals each day, eat three small meals with three small snacks.  Work with a dietitian to make a healthy meal plan that works for you.  Do not use any products that contain nicotine or tobacco, such as cigarettes and e-cigarettes. If you need help quitting, ask your health care provider.   Contact a health care provider if you:  Have a fever.  Have chills. Get help right away if you:  Have increased pain or cramping.  Vomit blood.  Have uncontrolled vomiting or nausea.  Cannot drink fluids because of vomiting or pain.  Become confused.  Begin feeling very thirsty (dehydrated).  Have severe bloating.  Feel extremely weak or you faint. Summary  A bowel obstruction is a blockage in the small or large bowel.  A bowel obstruction will prevent food and fluids from passing through the bowel as they normally do during digestion.  Treatment for this condition depends on the cause and severity of the problem. It may include fluids and pain  medicines through an IV, a simple diet, a nasogastric tube, or surgery.  Follow instructions from your health care provider about eating restrictions. You may need to avoid solid foods and consume only clear liquids until your condition improves. This information is not intended to replace advice given to you by your health care provider. Make sure you discuss any questions you have with your health care provider. Document Revised: 01/06/2019 Document Reviewed: 04/13/2018 Elsevier Patient Education  2021 Elsevier Inc.  

## 2021-02-27 NOTE — Care Management Important Message (Signed)
Important Message  Patient Details IM Letter given to the Patient. Name: Joyce Price MRN: [id number] Date of Birth: 04/26/1951   Medicare Important Message Given:  Yes     Caren Macadam 02/27/2021, 1:17 PM

## 2021-02-27 NOTE — Progress Notes (Signed)
Subjective: Continued to have BMs yesterday.  Tolerating full liquids and otherwise doing well.  ROS: See above, otherwise other systems negative  Objective: Vital signs in last 24 hours: Temp:  [98.2 F (36.8 C)-99 F (37.2 C)] 99 F (37.2 C) (03/17 0529) Pulse Rate:  [59-67] 59 (03/17 0529) Resp:  [16-17] 17 (03/17 0529) BP: (140-148)/(61-79) 140/64 (03/17 0529) SpO2:  [99 %-100 %] 99 % (03/17 0529) Weight:  [77 kg] 77 kg (03/17 1000) Last BM Date: 02/26/21  Intake/Output from previous day: 03/16 0701 - 03/17 0700 In: 672.6 [P.O.:320; I.V.:352.6] Out: -  Intake/Output this shift: No intake/output data recorded.  PE: Abd: soft, non tender, ND, +BS,   Lab Results:  Recent Labs    02/26/21 0437 02/27/21 0452  WBC 7.0 5.9  HGB 11.8* 11.5*  HCT 35.5* 33.0*  PLT 291 287   BMET Recent Labs    02/26/21 0437 02/27/21 0452  NA 137 139  K 3.5 3.5  CL 103 105  CO2 27 23  GLUCOSE 112* 105*  BUN 17 10  CREATININE 0.89 0.95  CALCIUM 8.1* 8.4*   PT/INR No results for input(s): LABPROT, INR in the last 72 hours. CMP     Component Value Date/Time   NA 139 02/27/2021 0452   NA 136 02/20/2021 1044   K 3.5 02/27/2021 0452   CL 105 02/27/2021 0452   CO2 23 02/27/2021 0452   GLUCOSE 105 (H) 02/27/2021 0452   BUN 10 02/27/2021 0452   BUN 21 02/20/2021 1044   CREATININE 0.95 02/27/2021 0452   CALCIUM 8.4 (L) 02/27/2021 0452   PROT 8.1 02/22/2021 1305   PROT 8.1 02/20/2021 1044   ALBUMIN 4.0 02/22/2021 1305   ALBUMIN 4.6 02/20/2021 1044   AST 34 02/22/2021 1305   ALT 30 02/22/2021 1305   ALKPHOS 57 02/22/2021 1305   BILITOT 1.6 (H) 02/22/2021 1305   BILITOT 1.0 02/20/2021 1044   GFRNONAA >60 02/27/2021 0452   Lipase     Component Value Date/Time   LIPASE 20 02/22/2021 1305       Studies/Results: DG ABD ACUTE 2+V W 1V CHEST  Result Date: 02/26/2021 CLINICAL DATA:  History of small bowel obstruction. EXAM: DG ABDOMEN ACUTE WITH 1 VIEW CHEST  COMPARISON:  CT 02/22/2021 and abdominal radiograph 02/24/2021 FINDINGS: Blunting at the right costophrenic angle appears new from the prior imaging. Findings are suggestive for small right pleural effusion. Otherwise, the lungs are clear. Heart size is normal. Negative for free air. Oral contrast has moved into the colon. There continues to be dilated loops of small bowel. Dilated loops of small bowel in the mid abdomen measure up to 3.5 cm similar to the exam from 02/24/2021. Again noted are prominent pelvic calcifications. No significant gas or contrast in the rectum. IMPRESSION: 1. Persistent dilated loops of small bowel. Oral contrast has moved into the colon. Findings are suggestive for a partial small bowel obstruction. The degree of small bowel distension has minimally changed since 02/24/2021. 2. New blunting at the right costophrenic angle and findings are suggestive for small right pleural effusion and/or atelectasis. Electronically Signed   By: Richarda Overlie M.D.   On: 02/26/2021 11:04    Anti-infectives: Anti-infectives (From admission, onward)   None       Assessment/Plan HTN HLD  SBO -resolved -soft diet, if tolerates stable for DC home from our standpoint. -D/W primary service -no surgical follow up needed  FEN - soft VTE - Lovenox  ID - none   LOS: 3 days    Letha Cape , [facility] 02/27/2021, 11:04 AM Please see Amion for pager number during day hours 7:00am-4:30pm or 7:00am -11:30am on weekends

## 2021-02-27 NOTE — Discharge Summary (Signed)
Physician Discharge Summary  Joyce Price PPJ:[id number] DOB: Oct 07, 1951 DOA: 02/22/2021  PCP: Merri Brunette, MD  Admit date: 02/22/2021 Discharge date: 02/27/2021  Admitted From: Home Disposition: Home  Recommendations for Outpatient Follow-up:  1. Follow up with PCP in 1 week 2. Please follow up on the following pending results: None  Home Health: None Equipment/Devices: None  Discharge Condition: Stable CODE STATUS: Full code Diet recommendation: Heart healthy   Brief/Interim Summary:  Admission HPI written by [name], MD  HPI:  This is a 70 year old female with a past medical history of hypertension, hyperlipidemia, distant history of SBO in the 90s which required unknown abdominal surgery who presented to the ED with 5 days of generalized abdominal pain, nausea, vomiting and constipation. She was seen in urgent care several days ago And was prescribed Colace and MiraLAX for constipation but she has not had any improvement.  She has been unable to keep any food down and has had some abdominal bloating.  Currently feeling a little better than when she presented but still not back to baseline.  Had a bowel movement yesterday morning but has not had one since.  No fever, chills, night sweats or any other complaints.    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 CT abdomen/pelvis on admission significant for partial SBO. General surgery consulted. NG tube placed on 3/13 and removed 3/16. Repeat x-ray with persistent dilated loops consistent with partial small bowel obstruction. Diet was advanced successfully. Cleared for discharge by general surgery service.  Primary hypertension Patient is on olmesartan-hydrochlorothiazide as an outpatient. BP is mildly uncontrolled -Resume home olmesartan-hydrochlorothiazide   Hyperlipidemia Patient is prescribed Crestor as an outpatient for which she is not taking  secondary to adverse effect of cramps. LDL of 223.  Peripheral swelling While admitted, patient was fluid resuscitated extensively leading to some fluid retention. No associated symptoms. Kidney function is good. Discussed with patient and decided on allowing fluid to naturally equilibrate over time. Recommend follow-up weight as an outpatient. Weight on discharge of 169.75 lbs (77 kg)  Discharge Diagnoses:  Principal Problem:   Partial small bowel obstruction (HCC) Active Problems:   Essential hypertension   Hyperlipidemia   SBO (small bowel obstruction) [facility])    Discharge Instructions  Discharge Instructions    Call MD for:  persistant nausea and vomiting   Complete by: As directed    Call MD for:  severe uncontrolled pain   Complete by: As directed    Increase activity slowly   Complete by: As directed      Allergies as of 02/27/2021      Reactions   Rosuvastatin Other (See Comments)   Cramps      Medication List    STOP taking these medications   amoxicillin-clavulanate 875-125 MG tablet Commonly known as: AUGMENTIN   rosuvastatin 20 MG tablet Commonly known as: CRESTOR     TAKE these medications   acetaminophen 500 MG tablet Commonly known as: TYLENOL Take 1,000 mg by mouth every 6 (six) hours as needed for moderate pain.   aspirin 81 MG tablet Take 81 mg by mouth daily.   cholecalciferol 25 MCG (1000 UNIT) tablet Commonly known as: VITAMIN D3 Take 1,000 Units by mouth daily.   docusate sodium 100 MG capsule Commonly known as: COLACE Take 1-2 capsules (100-200 mg total) by mouth 2 (two) times daily. What changed:   when to take this  reasons to take this  ipratropium 0.03 % nasal spray Commonly known as: ATROVENT Place 2 sprays into both nostrils 4 (four) times daily. What changed:   when to take this  reasons to take this   levocetirizine 5 MG tablet Commonly known as: Xyzal Take 1 tablet (5 mg total) by mouth every evening. What  changed:   when to take this  reasons to take this   olmesartan-hydrochlorothiazide 40-25 MG tablet Commonly known as: BENICAR HCT Take 1 tablet by mouth daily.   PATADAY OP Place 1 drop into both eyes 2 (two) times daily as needed (dry eyes).   polyethylene glycol powder 17 GM/SCOOP powder Commonly known as: GLYCOLAX/MIRALAX Take 17 g by mouth 2 (two) times daily as needed. What changed: reasons to take this       Follow-up Information    Merri Brunette, MD. Schedule an appointment as soon as possible for a visit in 1 week(s).   Specialty: Internal Medicine Why: Hospital follow-up Contact information: [address] [place] [address] [phone]              Allergies  Allergen Reactions  . Rosuvastatin Other (See Comments)    Cramps    Consultations:  General surgery   Procedures/Studies: DG Abd 1 View  Result Date: 02/23/2021 CLINICAL DATA:  Enteric catheter placement EXAM: ABDOMEN - 1 VIEW COMPARISON:  02/23/2021, 02/22/2021 FINDINGS: Supine frontal views of the abdomen and pelvis are obtained. Enteric catheter passes below diaphragm, tip overlying the gastric fundus. The enteric catheter appears kinked at the level of the side port. Please correlate with catheter function. Continued gaseous distention of the small bowel, measuring up to 4.5 cm. Findings are consistent with small-bowel obstruction. Stable uterine calcifications within the pelvis. IMPRESSION: 1. Enteric catheter tip overlying gastric fundus. The catheter appears kinked at the level of the side port. Please correlate with catheter function. 2. Persistent small bowel obstruction. Electronically Signed   By: Sharlet Salina M.D.   On: 02/23/2021 22:12   DG Abd 1 View  Result Date: 02/23/2021 CLINICAL DATA:  NG tube placement EXAM: ABDOMEN - 1 VIEW COMPARISON:  CT 02/22/2021 FINDINGS: Lung bases are clear. Esophageal tube is coiled within the stomach, the tip projects over the  mid gastric region. Dilated loops of bowel in the upper abdomen concerning for an obstruction. IMPRESSION: Esophageal tube coiled within the stomach, tip projects over the gastric body. Dilated loops of small bowel consistent with obstruction. Electronically Signed   By: Jasmine Pang M.D.   On: 02/23/2021 20:15   CT Abdomen Pelvis W Contrast  Result Date: 02/22/2021 CLINICAL DATA:  History of bowel adhesions, obstruction 25 years ago. Nausea, abdominal discomfort EXAM: CT ABDOMEN AND PELVIS WITH CONTRAST TECHNIQUE: Multidetector CT imaging of the abdomen and pelvis was performed using the standard protocol following bolus administration of intravenous contrast. CONTRAST:  OMNIPAQUE IOHEXOL 300 MG/ML  SOLN COMPARISON:  None. FINDINGS: Lower chest: No acute abnormality. Hepatobiliary: No solid liver abnormality is seen. No gallstones, gallbladder wall thickening, or biliary dilatation. Pancreas: Unremarkable. No pancreatic ductal dilatation or surrounding inflammatory changes. Spleen: Normal in size without significant abnormality. Adrenals/Urinary Tract: Adrenal glands are unremarkable. Kidneys are normal, without renal calculi, solid lesion, or hydronephrosis. Bladder is unremarkable. Stomach/Bowel: Stomach is within normal limits. Appendix appears normal. The mid to distal small bowel is mildly distended and fluid-filled, largest loops measuring up to 3.6 cm. There is an abrupt caliber change in the right lower quadrant, with decompression of the remaining loops  of terminal ileum (series 2, image 54, series 5, image 62). There is stool and scattered gas present in the colon. Vascular/Lymphatic: No significant vascular findings are present. No enlarged abdominal or pelvic lymph nodes. Reproductive: Calcified uterine fibroids. Other: No abdominal wall hernia or abnormality. No abdominopelvic ascites. Musculoskeletal: No acute or significant osseous findings. IMPRESSION: The mid to distal small bowel is  mildly distended and fluid-filled, largest loops measuring up to 3.6 cm. There is an abrupt caliber change in the right lower quadrant, with decompression of the remaining loops of terminal ileum. Findings are consistent with at least partial small bowel obstruction secondary to adhesion or stricture. Electronically Signed   By: Lauralyn PrimesAlex  Bibbey M.D.   On: 02/22/2021 15:02   DG ABD ACUTE 2+V W 1V CHEST  Result Date: 02/26/2021 CLINICAL DATA:  History of small bowel obstruction. EXAM: DG ABDOMEN ACUTE WITH 1 VIEW CHEST COMPARISON:  CT 02/22/2021 and abdominal radiograph 02/24/2021 FINDINGS: Blunting at the right costophrenic angle appears new from the prior imaging. Findings are suggestive for small right pleural effusion. Otherwise, the lungs are clear. Heart size is normal. Negative for free air. Oral contrast has moved into the colon. There continues to be dilated loops of small bowel. Dilated loops of small bowel in the mid abdomen measure up to 3.5 cm similar to the exam from 02/24/2021. Again noted are prominent pelvic calcifications. No significant gas or contrast in the rectum. IMPRESSION: 1. Persistent dilated loops of small bowel. Oral contrast has moved into the colon. Findings are suggestive for a partial small bowel obstruction. The degree of small bowel distension has minimally changed since 02/24/2021. 2. New blunting at the right costophrenic angle and findings are suggestive for small right pleural effusion and/or atelectasis. Electronically Signed   By: Richarda OverlieAdam  Henn M.D.   On: 02/26/2021 11:04   DG Abd Portable 1V-Small Bowel Obstruction Protocol-initial, 8 hr delay  Result Date: 02/24/2021 CLINICAL DATA:  8 hour delay small bowel protocol EXAM: PORTABLE ABDOMEN - 1 VIEW COMPARISON:  02/23/2021, CT 02/22/2021 FINDINGS: Persistent gaseous dilatation of central small bowel up to 4.6 cm. Possible dilute contrast within right abdominal bowel loops, no definitive colon contrast is evident. Calcified  fibroids in the pelvis. IMPRESSION: Questionable dilute contrast within right abdominal bowel loops, no definitive colon or rectal contrast is evident. Persistent gaseous dilatation of small bowel consistent with obstruction Electronically Signed   By: Jasmine PangKim  Fujinaga M.D.   On: 02/24/2021 20:40      Subjective: No abdominal pain, nausea or vomiting. She has noticed some peripheral swelling in addition to some swelling of her abdomen.  Discharge Exam: Vitals:   02/26/21 2039 02/27/21 0529  BP: 140/61 140/64  Pulse: 66 (!) 59  Resp: 16 17  Temp: 98.2 F (36.8 C) 99 F (37.2 C)  SpO2: 99% 99%   Vitals:   02/26/21 1231 02/26/21 2039 02/27/21 0529 02/27/21 1000  BP: (!) 148/79 140/61 140/64   Pulse: 67 66 (!) 59   Resp:  16 17   Temp: 98.3 F (36.8 C) 98.2 F (36.8 C) 99 F (37.2 C)   TempSrc: Oral Oral Oral   SpO2: 100% 99% 99%   Weight:    77 kg  Height:        General: Pt is alert, awake, not in acute distress Cardiovascular: RRR, S1/S2 +, no rubs, no gallops Respiratory: CTA bilaterally, no wheezing, no rhonchi Abdominal: Soft, NT, ND, bowel sounds + Extremities: mild peripheral swelling, no cyanosis  The results of significant diagnostics from this hospitalization (including imaging, microbiology, ancillary and laboratory) are listed below for reference.     Microbiology: Recent Results (from the past 240 hour(s))  Respiratory Panel by RT PCR (Flu A&B, Covid) -     Status: None   Collection Time: 02/22/21  5:21 PM  Result Value Ref Range Status   SARS Coronavirus 2 by RT PCR NEGATIVE NEGATIVE Final   Influenza A by PCR NEGATIVE NEGATIVE Final   Influenza B by PCR NEGATIVE NEGATIVE Final    Comment: Performed at [facility], [address] [place], [place] [address]     Labs: BNP (last 3 results) No results for input(s): BNP in the last 8760 hours. Basic Metabolic Panel: Recent Labs  Lab 02/23/21 0557 02/24/21 0435 02/25/21 0517  02/26/21 0437 02/27/21 0452  NA 138 140 141 137 139  K 3.2* 3.3* 3.6 3.5 3.5  CL 98 101 102 103 105  CO2 26 27 27 27 23   GLUCOSE 98 87 89 112* 105*  BUN 25* 25* 24* 17 10  CREATININE 0.99 0.82 0.85 0.89 0.95  CALCIUM 8.9 8.9 8.4* 8.1* 8.4*   Liver Function Tests: Recent Labs  Lab 02/22/21 1305  AST 34  ALT 30  ALKPHOS 57  BILITOT 1.6*  PROT 8.1  ALBUMIN 4.0   Recent Labs  Lab 02/22/21 1305  LIPASE 20   No results for input(s): AMMONIA in the last 168 hours. CBC: Recent Labs  Lab 02/23/21 0557 02/24/21 0435 02/25/21 0517 02/26/21 0437 02/27/21 0452  WBC 6.7 7.3 8.5 7.0 5.9  HGB 14.4 12.5 11.9* 11.8* 11.5*  HCT 41.8 36.5 35.4* 35.5* 33.0*  MCV 88.2 88.4 89.6 89.4 88.2  PLT 295 294 294 291 287   Cardiac Enzymes: No results for input(s): CKTOTAL, CKMB, CKMBINDEX, TROPONINI in the last 168 hours. BNP: Invalid input(s): POCBNP CBG: No results for input(s): GLUCAP in the last 168 hours. D-Dimer No results for input(s): DDIMER in the last 72 hours. Hgb A1c No results for input(s): HGBA1C in the last 72 hours. Lipid Profile No results for input(s): CHOL, HDL, LDLCALC, TRIG, CHOLHDL, LDLDIRECT in the last 72 hours. Thyroid function studies No results for input(s): TSH, T4TOTAL, T3FREE, THYROIDAB in the last 72 hours.  Invalid input(s): FREET3 Anemia work up No results for input(s): VITAMINB12, FOLATE, FERRITIN, TIBC, IRON, RETICCTPCT in the last 72 hours. Urinalysis    Component Value Date/Time   BILIRUBINUR small (A) 02/20/2021 1012   KETONESUR small (15) (A) 02/20/2021 1012   PROTEINUR =100 (A) 02/20/2021 1012   UROBILINOGEN 0.2 02/20/2021 1012   NITRITE Negative 02/20/2021 1012   LEUKOCYTESUR Negative 02/20/2021 1012   Sepsis Labs Invalid input(s): PROCALCITONIN,  WBC,  LACTICIDVEN Microbiology Recent Results (from the past 240 hour(s))  Respiratory Panel by RT PCR (Flu A&B, Covid) -     Status: None   Collection Time: 02/22/21  5:21 PM  Result  Value Ref Range Status   SARS Coronavirus 2 by RT PCR NEGATIVE NEGATIVE Final   Influenza A by PCR NEGATIVE NEGATIVE Final   Influenza B by PCR NEGATIVE NEGATIVE Final    Comment: Performed at [facility], [address], [place], [place]     Time coordinating discharge: 35 minutes  SIGNED:   [id number], MD [facility] 02/27/2021, 1:08 PM

## 2021-02-27 NOTE — Progress Notes (Signed)
Reviewed written d/c instructions w pt and all questions answered. Pt verb. Understanding. D/c via w/c w all belongings in stable condition.

## 2021-04-01 DIAGNOSIS — Z09 Encounter for follow-up examination after completed treatment for conditions other than malignant neoplasm: Secondary | ICD-10-CM | POA: Diagnosis not present

## 2021-04-01 DIAGNOSIS — I10 Essential (primary) hypertension: Secondary | ICD-10-CM | POA: Diagnosis not present

## 2021-04-01 DIAGNOSIS — Z8719 Personal history of other diseases of the digestive system: Secondary | ICD-10-CM | POA: Diagnosis not present

## 2021-04-01 DIAGNOSIS — E7800 Pure hypercholesterolemia, unspecified: Secondary | ICD-10-CM | POA: Diagnosis not present

## 2021-05-21 DIAGNOSIS — D72819 Decreased white blood cell count, unspecified: Secondary | ICD-10-CM | POA: Diagnosis not present

## 2021-05-21 DIAGNOSIS — I10 Essential (primary) hypertension: Secondary | ICD-10-CM | POA: Diagnosis not present

## 2021-05-21 DIAGNOSIS — E7800 Pure hypercholesterolemia, unspecified: Secondary | ICD-10-CM | POA: Diagnosis not present

## 2021-05-26 DIAGNOSIS — E7800 Pure hypercholesterolemia, unspecified: Secondary | ICD-10-CM | POA: Diagnosis not present

## 2021-05-26 DIAGNOSIS — Z0000 Encounter for general adult medical examination without abnormal findings: Secondary | ICD-10-CM | POA: Diagnosis not present

## 2021-06-12 ENCOUNTER — Ambulatory Visit: Payer: Medicare Other | Admitting: Podiatry

## 2021-06-12 ENCOUNTER — Other Ambulatory Visit: Payer: Self-pay

## 2021-06-12 DIAGNOSIS — L603 Nail dystrophy: Secondary | ICD-10-CM | POA: Diagnosis not present

## 2021-06-12 DIAGNOSIS — L601 Onycholysis: Secondary | ICD-10-CM | POA: Diagnosis not present

## 2021-06-12 DIAGNOSIS — L608 Other nail disorders: Secondary | ICD-10-CM | POA: Diagnosis not present

## 2021-06-12 MED ORDER — NEOMYCIN-POLYMYXIN-HC 3.5-10000-1 OT SOLN: OTIC | 0 refills | Status: AC

## 2021-06-12 NOTE — Patient Instructions (Signed)

## 2021-06-12 NOTE — Progress Notes (Signed)
Subjective:  Patient ID: Joyce Price, female    DOB: Aug 16, 1951,  MRN: [id number] HPI Chief Complaint  Patient presents with   Nail Problem    Right 1st nail dark discoloration, raised due to a nail injury 1 week ago. Pt states it is non-painful.    70 y.o. female presents with the above complaint.   ROS: Denies fever chills nausea vomiting muscle aches pains calf pain back pain chest pain shortness of breath.  Past Medical History:  Diagnosis Date   Hypertension    Past Surgical History:  Procedure Laterality Date   EXPLORATORY LAPAROTOMY  12/1997   [place]   LYSIS OF ADHESION  1998    Current Outpatient Medications:    acetaminophen (TYLENOL) 500 MG tablet, Take 1,000 mg by mouth every 6 (six) hours as needed for moderate pain., Disp: , Rfl:    aspirin 81 MG tablet, Take 81 mg by mouth daily., Disp: , Rfl:    cholecalciferol (VITAMIN D3) 25 MCG (1000 UNIT) tablet, Take 1,000 Units by mouth daily., Disp: , Rfl:    docusate sodium (COLACE) 100 MG capsule, Take 1-2 capsules (100-200 mg total) by mouth 2 (two) times daily. (Patient taking differently: Take 100-200 mg by mouth daily as needed for mild constipation.), Disp: 50 capsule, Rfl: 2   ipratropium (ATROVENT) 0.03 % nasal spray, Place 2 sprays into both nostrils 4 (four) times daily. (Patient taking differently: Place 2 sprays into both nostrils daily as needed for rhinitis.), Disp: 30 mL, Rfl: 0   levocetirizine (XYZAL) 5 MG tablet, Take 1 tablet (5 mg total) by mouth every evening. (Patient taking differently: Take 5 mg by mouth daily as needed for allergies.), Disp: 30 tablet, Rfl: 11   neomycin-polymyxin-hydrocortisone (CORTISPORIN) OTIC solution, Apply one to two drops to toe after soaking twice daily., Disp: 10 mL, Rfl: 0   olmesartan-hydrochlorothiazide (BENICAR HCT) 40-25 MG tablet, Take 1 tablet by mouth daily., Disp: , Rfl:    Olopatadine HCl (PATADAY OP), Place 1 drop into both eyes 2 (two) times daily as  needed (dry eyes)., Disp: , Rfl:    PFIZER-BIONTECH COVID-19 VACC 30 MCG/0.3ML injection, , Disp: , Rfl:    polyethylene glycol powder (GLYCOLAX/MIRALAX) 17 GM/SCOOP powder, Take 17 g by mouth 2 (two) times daily as needed. (Patient taking differently: Take 17 g by mouth 2 (two) times daily as needed for moderate constipation.), Disp: 3350 g, Rfl: 1  Allergies  Allergen Reactions   Rosuvastatin Other (See Comments)    Cramps   Review of Systems Objective:  There were no vitals filed for this visit.  General: Well developed, nourished, in no acute distress, alert and oriented x3   Dermatological: Skin is warm, dry and supple bilateral. Nails x 10 are well maintained; remaining integument appears unremarkable at this time. There are no open sores, no preulcerative lesions, no rash or signs of infection present.  Distal onycholysis hallux right with some subungual debris more than likely dystrophic rather than mycotic.  Vascular: Dorsalis Pedis artery and Posterior Tibial artery pedal pulses are 2/4 bilateral with immedate capillary fill time. Pedal hair growth present. No varicosities and no lower extremity edema present bilateral.   Neruologic: Grossly intact via light touch bilateral. Vibratory intact via tuning fork bilateral. Protective threshold with Semmes Wienstein monofilament intact to all pedal sites bilateral. Patellar and Achilles deep tendon reflexes 2+ bilateral. No Babinski or clonus noted bilateral.   Musculoskeletal: No gross boney pedal deformities bilateral. No pain, crepitus, or limitation  noted with foot and ankle range of motion bilateral. Muscular strength 5/5 in all groups tested bilateral.  Gait: Unassisted, Nonantalgic.    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  Plan: Total nail avulsion was performed today after local anesthetic was administered.  Tolerated procedure well without  complications.  [facility].  I will follow-up with that in about 3 weeks.  We provided her with both oral and written home-going instructions including soaking instructions as well as a prescription for Cortisporin Otic to be applied twice daily after soaking.  I will follow-up with her in a couple of weeks     Malcome Ambrocio T. [place], [place]

## 2021-07-03 ENCOUNTER — Ambulatory Visit: Payer: Medicare Other | Admitting: Podiatry

## 2021-07-03 ENCOUNTER — Encounter: Payer: Self-pay | Admitting: Podiatry

## 2021-07-03 ENCOUNTER — Other Ambulatory Visit: Payer: Self-pay

## 2021-07-03 DIAGNOSIS — L603 Nail dystrophy: Secondary | ICD-10-CM

## 2021-07-03 DIAGNOSIS — Z9889 Other specified postprocedural states: Secondary | ICD-10-CM | POA: Diagnosis not present

## 2021-07-05 NOTE — Progress Notes (Signed)
She presents today for follow-up of her nail avulsion hallux right.  She states the toe seems to be doing fine.  Objective: Vital signs are stable she alert oriented x3 there is no erythema edema cellulitis drainage or odor toenail appears to be starting to grow already.  Pathology results back from the nail demonstrate nailbed keratinization no fungal elements and no infection itself.  Assessment: Well-healing surgical foot and toe.  Plan: I encouraged her to soak the toe once every other day and Epson salts and warm water.  Follow-up with me as needed.

## 2021-07-10 ENCOUNTER — Telehealth: Payer: Self-pay | Admitting: *Deleted

## 2021-07-10 NOTE — Telephone Encounter (Signed)
Called patient, no answer, left vmessage that labs for fungus are negative,may cancel  upcoming if she wishes.

## 2021-12-03 ENCOUNTER — Other Ambulatory Visit: Payer: Self-pay | Admitting: Internal Medicine

## 2021-12-03 DIAGNOSIS — Z1231 Encounter for screening mammogram for malignant neoplasm of breast: Secondary | ICD-10-CM

## 2021-12-18 DIAGNOSIS — R0981 Nasal congestion: Secondary | ICD-10-CM | POA: Diagnosis not present

## 2021-12-18 DIAGNOSIS — J309 Allergic rhinitis, unspecified: Secondary | ICD-10-CM | POA: Diagnosis not present

## 2021-12-18 DIAGNOSIS — J3489 Other specified disorders of nose and nasal sinuses: Secondary | ICD-10-CM | POA: Diagnosis not present

## 2022-01-01 ENCOUNTER — Ambulatory Visit: Payer: Medicare Other

## 2022-01-09 ENCOUNTER — Ambulatory Visit
Admission: RE | Admit: 2022-01-09 | Discharge: 2022-01-09 | Disposition: A | Discharge status: Home / Self Care | Payer: Medicare Other | Source: Ambulatory Visit | Attending: Internal Medicine | Admitting: Internal Medicine

## 2022-01-09 DIAGNOSIS — Z1231 Encounter for screening mammogram for malignant neoplasm of breast: Secondary | ICD-10-CM

## 2022-06-02 DIAGNOSIS — Z0000 Encounter for general adult medical examination without abnormal findings: Secondary | ICD-10-CM | POA: Diagnosis not present

## 2022-06-02 DIAGNOSIS — E559 Vitamin D deficiency, unspecified: Secondary | ICD-10-CM | POA: Diagnosis not present

## 2022-06-02 DIAGNOSIS — E7800 Pure hypercholesterolemia, unspecified: Secondary | ICD-10-CM | POA: Diagnosis not present

## 2022-06-02 DIAGNOSIS — I10 Essential (primary) hypertension: Secondary | ICD-10-CM | POA: Diagnosis not present

## 2022-06-09 DIAGNOSIS — E7800 Pure hypercholesterolemia, unspecified: Secondary | ICD-10-CM | POA: Diagnosis not present

## 2022-06-09 DIAGNOSIS — I10 Essential (primary) hypertension: Secondary | ICD-10-CM | POA: Diagnosis not present

## 2022-06-09 DIAGNOSIS — E559 Vitamin D deficiency, unspecified: Secondary | ICD-10-CM | POA: Diagnosis not present

## 2022-06-09 DIAGNOSIS — Z0000 Encounter for general adult medical examination without abnormal findings: Secondary | ICD-10-CM | POA: Diagnosis not present

## 2022-08-26 DIAGNOSIS — R059 Cough, unspecified: Secondary | ICD-10-CM | POA: Diagnosis not present

## 2022-12-23 ENCOUNTER — Other Ambulatory Visit: Payer: Self-pay | Admitting: Internal Medicine

## 2022-12-23 DIAGNOSIS — Z1231 Encounter for screening mammogram for malignant neoplasm of breast: Secondary | ICD-10-CM

## 2023-02-12 ENCOUNTER — Ambulatory Visit
Admission: RE | Admit: 2023-02-12 | Discharge: 2023-02-12 | Disposition: A | Discharge status: Home / Self Care | Payer: Medicare Other | Source: Ambulatory Visit | Attending: Internal Medicine | Admitting: Internal Medicine

## 2023-02-12 DIAGNOSIS — Z1231 Encounter for screening mammogram for malignant neoplasm of breast: Secondary | ICD-10-CM | POA: Diagnosis not present

## 2023-06-16 DIAGNOSIS — I10 Essential (primary) hypertension: Secondary | ICD-10-CM | POA: Diagnosis not present

## 2023-06-23 DIAGNOSIS — E559 Vitamin D deficiency, unspecified: Secondary | ICD-10-CM | POA: Diagnosis not present

## 2023-06-23 DIAGNOSIS — J302 Other seasonal allergic rhinitis: Secondary | ICD-10-CM | POA: Diagnosis not present

## 2023-06-23 DIAGNOSIS — I10 Essential (primary) hypertension: Secondary | ICD-10-CM | POA: Diagnosis not present

## 2023-06-23 DIAGNOSIS — R9431 Abnormal electrocardiogram [ECG] [EKG]: Secondary | ICD-10-CM | POA: Diagnosis not present

## 2023-06-23 DIAGNOSIS — Z0000 Encounter for general adult medical examination without abnormal findings: Secondary | ICD-10-CM | POA: Diagnosis not present

## 2023-06-23 DIAGNOSIS — R7989 Other specified abnormal findings of blood chemistry: Secondary | ICD-10-CM | POA: Diagnosis not present

## 2023-06-24 DIAGNOSIS — R7989 Other specified abnormal findings of blood chemistry: Secondary | ICD-10-CM | POA: Diagnosis not present

## 2023-08-09 IMAGING — MG MM DIGITAL SCREENING BILAT W/ TOMO AND CAD
8 series · 9 of 24 positions shown · non-contrast
Comparison: Previous exam(s).

CLINICAL DATA: Screening.

EXAM:
DIGITAL SCREENING BILATERAL MAMMOGRAM WITH TOMOSYNTHESIS AND CAD
TECHNIQUE: Bilateral screening digital craniocaudal and mediolateral oblique
mammograms were obtained. Bilateral screening digital breast
tomosynthesis was performed. The images were evaluated with
computer-aided detection.

[R CC synth-2D]
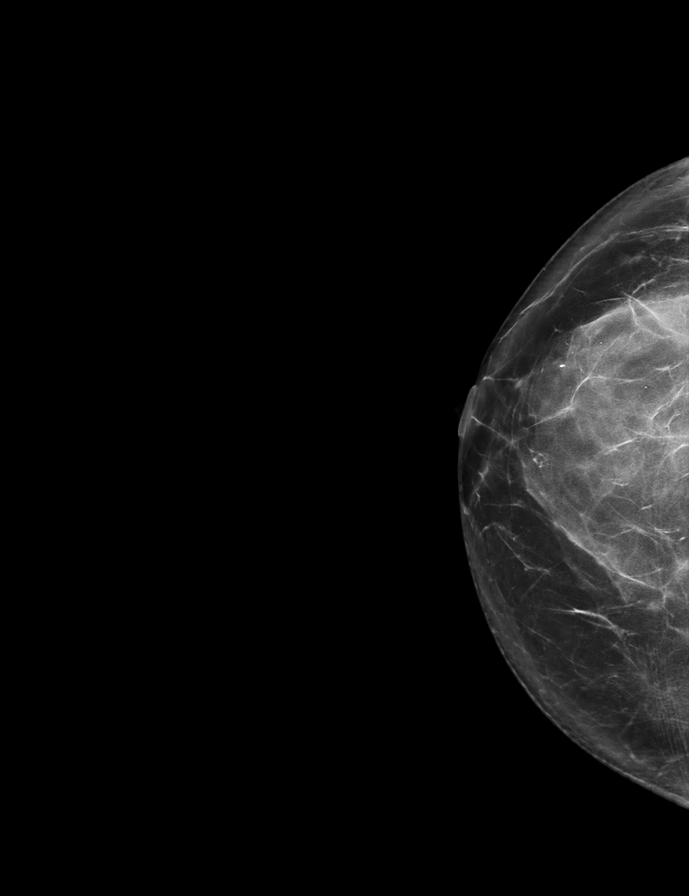

[L CC synth-2D]
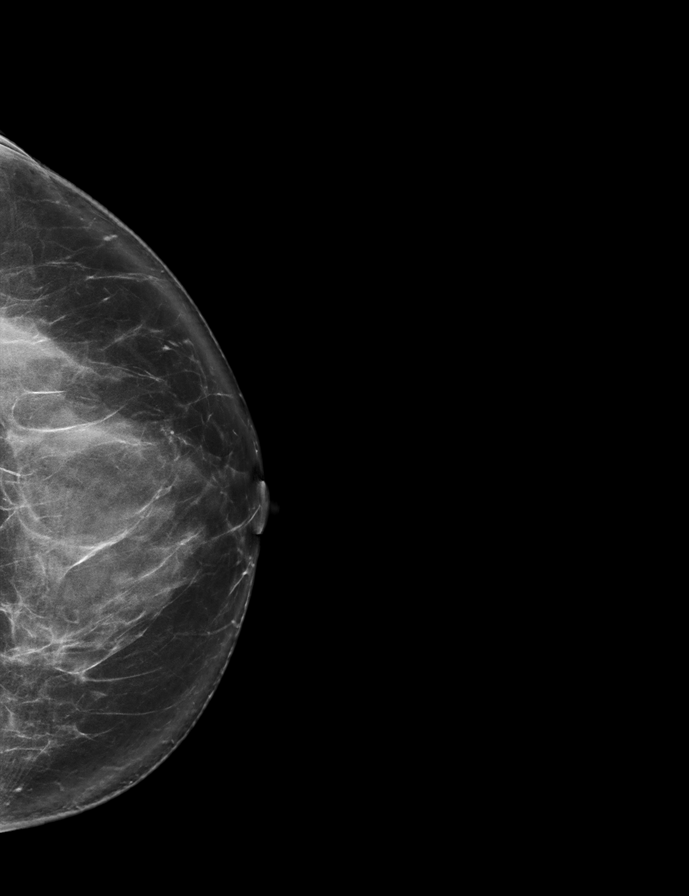

[L MLO synth-2D]
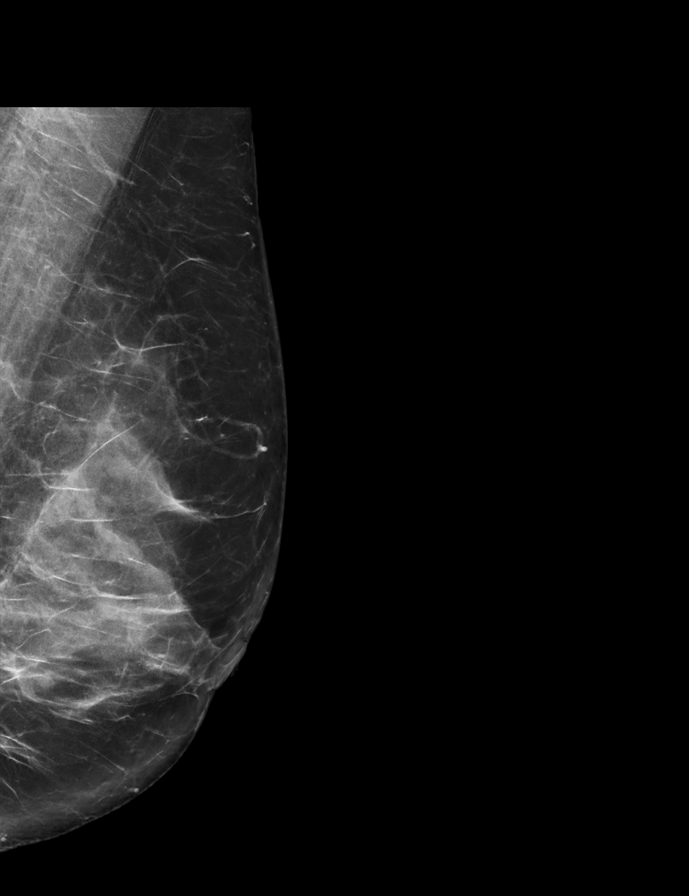

[R MLO synth-2D]
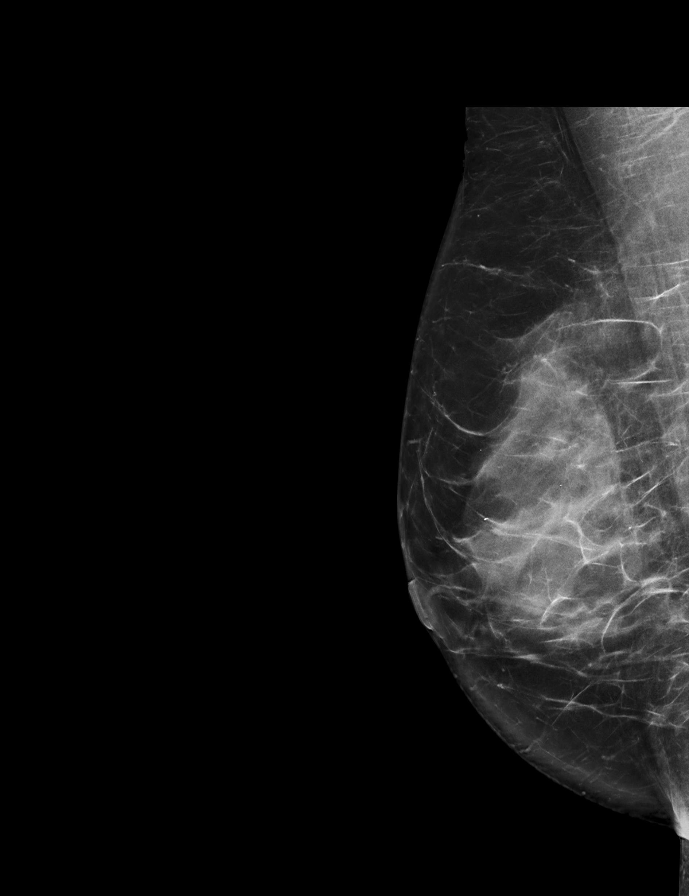

[R CC tomo · 2 of 73 frames shown]
[frame 24/73]
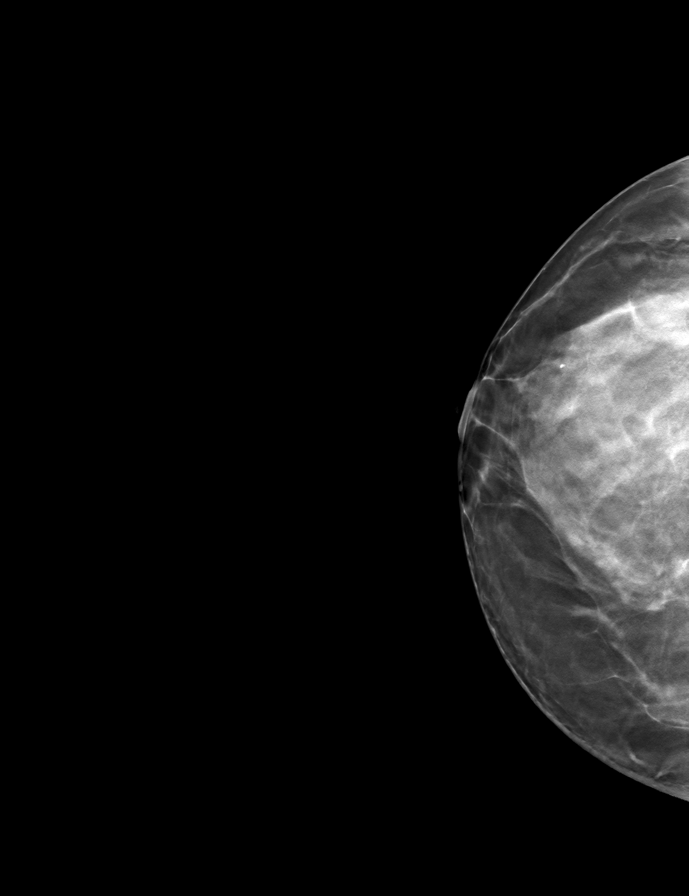
[frame 37/73]
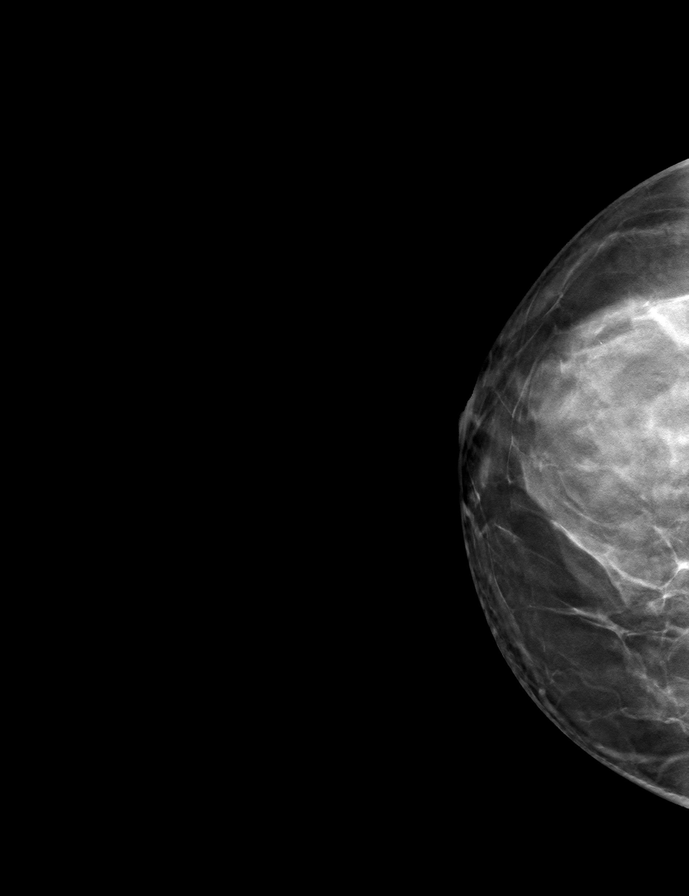

[L CC tomo · tomo slice 39/78.0]
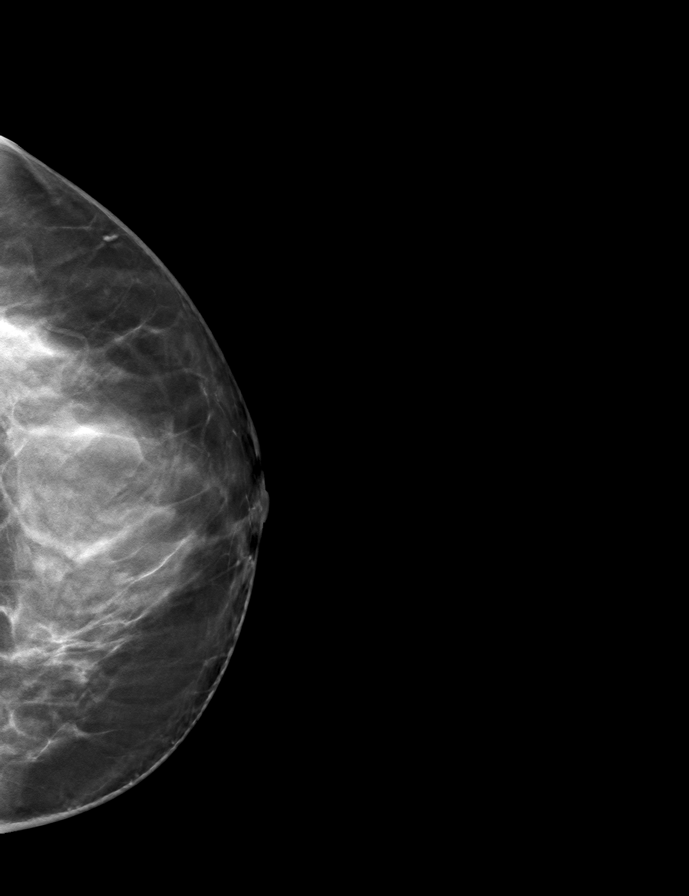

[R MLO tomo · tomo slice 40/79.0]
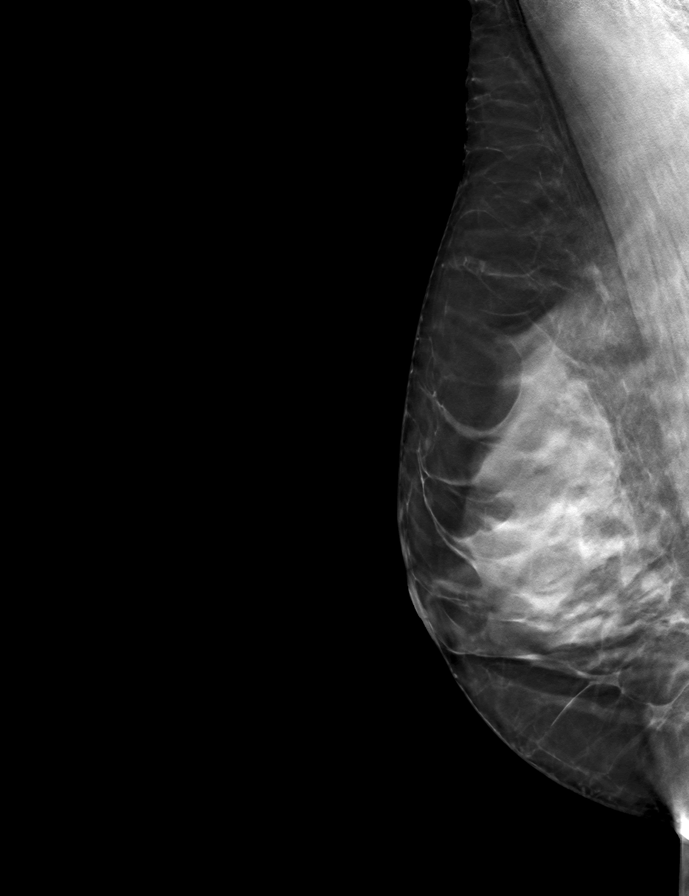

[L MLO tomo · tomo slice 39/76.0]
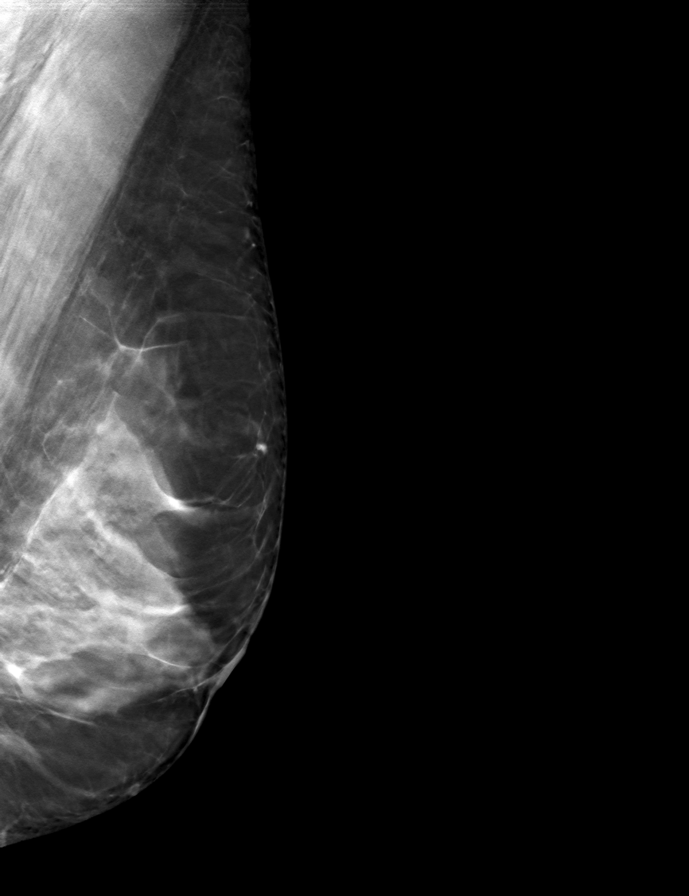

[9 of 24 positions shown; findings below may reference images not displayed]

ACR Breast Density Category c: The breast tissue is heterogeneously
dense, which may obscure small masses.
FINDINGS: There are no findings suspicious for malignancy.
IMPRESSION: No mammographic evidence of malignancy. A result letter of this
screening mammogram will be mailed directly to the patient.

RECOMMENDATION:
Screening mammogram in one year. (Code:Q3-W-BC3)

BI-RADS CATEGORY  1: Negative.

## 2024-02-02 ENCOUNTER — Other Ambulatory Visit: Payer: Self-pay | Admitting: Internal Medicine

## 2024-02-02 DIAGNOSIS — Z1231 Encounter for screening mammogram for malignant neoplasm of breast: Secondary | ICD-10-CM

## 2024-02-24 ENCOUNTER — Ambulatory Visit
Admission: RE | Admit: 2024-02-24 | Discharge: 2024-02-24 | Disposition: A | Discharge status: Home / Self Care | Payer: Medicare Other | Source: Ambulatory Visit | Attending: Internal Medicine | Admitting: Internal Medicine

## 2024-02-24 DIAGNOSIS — Z1231 Encounter for screening mammogram for malignant neoplasm of breast: Secondary | ICD-10-CM

## 2024-06-22 DIAGNOSIS — I10 Essential (primary) hypertension: Secondary | ICD-10-CM | POA: Diagnosis not present

## 2024-06-22 DIAGNOSIS — E7800 Pure hypercholesterolemia, unspecified: Secondary | ICD-10-CM | POA: Diagnosis not present

## 2024-06-27 DIAGNOSIS — E559 Vitamin D deficiency, unspecified: Secondary | ICD-10-CM | POA: Diagnosis not present

## 2024-06-27 DIAGNOSIS — N1831 Chronic kidney disease, stage 3a: Secondary | ICD-10-CM | POA: Diagnosis not present

## 2024-06-27 DIAGNOSIS — Z8719 Personal history of other diseases of the digestive system: Secondary | ICD-10-CM | POA: Diagnosis not present

## 2024-06-27 DIAGNOSIS — Z0000 Encounter for general adult medical examination without abnormal findings: Secondary | ICD-10-CM | POA: Diagnosis not present

## 2024-06-27 DIAGNOSIS — J302 Other seasonal allergic rhinitis: Secondary | ICD-10-CM | POA: Diagnosis not present

## 2024-06-27 DIAGNOSIS — I10 Essential (primary) hypertension: Secondary | ICD-10-CM | POA: Diagnosis not present
